# Patient Record
Sex: Female | Born: 1970 | Race: White | Hispanic: No | Marital: Married | Smoking: Former smoker
Health system: Southern US, Community
[De-identification: ages and names within clinical notes are randomized; demographics above are authoritative.]

## PROBLEM LIST (undated history)

## (undated) DIAGNOSIS — I471 Supraventricular tachycardia, unspecified: Secondary | ICD-10-CM

## (undated) DIAGNOSIS — C801 Malignant (primary) neoplasm, unspecified: Secondary | ICD-10-CM

## (undated) DIAGNOSIS — E039 Hypothyroidism, unspecified: Secondary | ICD-10-CM

## (undated) DIAGNOSIS — F419 Anxiety disorder, unspecified: Secondary | ICD-10-CM

## (undated) DIAGNOSIS — I499 Cardiac arrhythmia, unspecified: Secondary | ICD-10-CM

## (undated) HISTORY — PX: ABDOMINAL HYSTERECTOMY: SHX81

## (undated) HISTORY — PX: TONSILLECTOMY: SUR1361

## (undated) HISTORY — PX: ULNAR NERVE REPAIR: SHX2594

## (undated) HISTORY — PX: CARDIAC ELECTROPHYSIOLOGY MAPPING AND ABLATION: SHX1292

---

## 2004-08-10 ENCOUNTER — Inpatient Hospital Stay: Payer: Self-pay | Admitting: Internal Medicine

## 2004-10-10 ENCOUNTER — Emergency Department: Payer: Self-pay | Admitting: Emergency Medicine

## 2004-10-10 ENCOUNTER — Other Ambulatory Visit: Payer: Self-pay

## 2005-09-19 ENCOUNTER — Emergency Department: Payer: Self-pay | Admitting: Internal Medicine

## 2005-09-19 ENCOUNTER — Other Ambulatory Visit: Payer: Self-pay

## 2005-09-24 ENCOUNTER — Ambulatory Visit: Payer: Self-pay | Admitting: Family Medicine

## 2006-04-09 ENCOUNTER — Other Ambulatory Visit: Payer: Self-pay

## 2006-04-09 ENCOUNTER — Emergency Department: Payer: Self-pay

## 2006-07-07 ENCOUNTER — Ambulatory Visit: Payer: Self-pay | Admitting: Family Medicine

## 2007-08-27 ENCOUNTER — Ambulatory Visit: Payer: Self-pay | Admitting: Unknown Physician Specialty

## 2007-08-31 ENCOUNTER — Ambulatory Visit: Payer: Self-pay | Admitting: Unknown Physician Specialty

## 2010-03-20 ENCOUNTER — Ambulatory Visit: Payer: Self-pay | Admitting: Gastroenterology

## 2010-03-22 LAB — PATHOLOGY REPORT

## 2010-08-12 ENCOUNTER — Ambulatory Visit: Payer: Self-pay | Admitting: Physical Medicine and Rehabilitation

## 2011-10-09 ENCOUNTER — Ambulatory Visit: Payer: Self-pay | Admitting: Gastroenterology

## 2011-10-16 LAB — PATHOLOGY REPORT

## 2012-04-30 ENCOUNTER — Ambulatory Visit: Payer: Self-pay | Admitting: Family Medicine

## 2013-02-17 ENCOUNTER — Ambulatory Visit: Payer: Self-pay | Admitting: Family Medicine

## 2013-05-04 ENCOUNTER — Ambulatory Visit: Payer: Self-pay | Admitting: Family Medicine

## 2013-07-23 ENCOUNTER — Ambulatory Visit: Payer: Self-pay

## 2014-06-14 ENCOUNTER — Ambulatory Visit: Payer: Self-pay

## 2014-06-18 LAB — WOUND CULTURE

## 2014-07-05 ENCOUNTER — Ambulatory Visit: Payer: Self-pay | Admitting: Family Medicine

## 2014-09-02 ENCOUNTER — Ambulatory Visit
Admit: 2014-09-02 | Disposition: A | Discharge status: Home / Self Care | Payer: Self-pay | Attending: Family Medicine | Admitting: Family Medicine

## 2015-03-02 ENCOUNTER — Encounter: Payer: Self-pay | Admitting: *Deleted

## 2015-03-03 ENCOUNTER — Ambulatory Visit: Payer: 59 | Admitting: Anesthesiology

## 2015-03-03 ENCOUNTER — Encounter: Payer: Self-pay | Admitting: Anesthesiology

## 2015-03-03 ENCOUNTER — Encounter
Admission: RE | Disposition: A | Discharge status: Home / Self Care | Payer: Self-pay | Source: Ambulatory Visit | Attending: Gastroenterology

## 2015-03-03 ENCOUNTER — Ambulatory Visit
Admission: RE | Admit: 2015-03-03 | Discharge: 2015-03-03 | Disposition: A | Discharge status: Home / Self Care | Payer: 59 | Source: Ambulatory Visit | Attending: Gastroenterology | Admitting: Gastroenterology

## 2015-03-03 DIAGNOSIS — F419 Anxiety disorder, unspecified: Secondary | ICD-10-CM | POA: Insufficient documentation

## 2015-03-03 DIAGNOSIS — Z888 Allergy status to other drugs, medicaments and biological substances status: Secondary | ICD-10-CM | POA: Diagnosis not present

## 2015-03-03 DIAGNOSIS — Z800 Family history of malignant neoplasm of digestive organs: Secondary | ICD-10-CM | POA: Diagnosis not present

## 2015-03-03 DIAGNOSIS — Z1211 Encounter for screening for malignant neoplasm of colon: Secondary | ICD-10-CM | POA: Insufficient documentation

## 2015-03-03 DIAGNOSIS — K5730 Diverticulosis of large intestine without perforation or abscess without bleeding: Secondary | ICD-10-CM | POA: Diagnosis not present

## 2015-03-03 DIAGNOSIS — Z79899 Other long term (current) drug therapy: Secondary | ICD-10-CM | POA: Insufficient documentation

## 2015-03-03 DIAGNOSIS — E039 Hypothyroidism, unspecified: Secondary | ICD-10-CM | POA: Insufficient documentation

## 2015-03-03 DIAGNOSIS — Z880 Allergy status to penicillin: Secondary | ICD-10-CM | POA: Insufficient documentation

## 2015-03-03 DIAGNOSIS — Z882 Allergy status to sulfonamides status: Secondary | ICD-10-CM | POA: Diagnosis not present

## 2015-03-03 DIAGNOSIS — D125 Benign neoplasm of sigmoid colon: Secondary | ICD-10-CM | POA: Insufficient documentation

## 2015-03-03 HISTORY — DX: Anxiety disorder, unspecified: F41.9

## 2015-03-03 HISTORY — DX: Hypothyroidism, unspecified: E03.9

## 2015-03-03 HISTORY — DX: Cardiac arrhythmia, unspecified: I49.9

## 2015-03-03 HISTORY — PX: COLONOSCOPY WITH PROPOFOL: SHX5780

## 2015-03-03 SURGERY — COLONOSCOPY WITH PROPOFOL
Anesthesia: General

## 2015-03-03 MED ORDER — FENTANYL CITRATE (PF) 100 MCG/2ML IJ SOLN
INTRAMUSCULAR | Status: DC | PRN
  Administered 2015-03-03: 50 ug via INTRAVENOUS

## 2015-03-03 MED ORDER — EPHEDRINE SULFATE 50 MG/ML IJ SOLN
INTRAMUSCULAR | Status: DC | PRN
  Administered 2015-03-03: 15 mg via INTRAVENOUS

## 2015-03-03 MED ORDER — SODIUM CHLORIDE 0.9 % IV SOLN
INTRAVENOUS | Status: DC
  Administered 2015-03-03: 1000 mL via INTRAVENOUS

## 2015-03-03 MED ORDER — SODIUM CHLORIDE 0.9 % IV SOLN: INTRAVENOUS | Status: DC

## 2015-03-03 MED ORDER — PROPOFOL 500 MG/50ML IV EMUL
INTRAVENOUS | Status: DC | PRN
  Administered 2015-03-03: 120 ug/kg/min via INTRAVENOUS

## 2015-03-03 MED ORDER — MIDAZOLAM HCL 2 MG/2ML IJ SOLN
INTRAMUSCULAR | Status: DC | PRN
  Administered 2015-03-03: 1 mg via INTRAVENOUS

## 2015-03-03 MED ORDER — NEBIVOLOL HCL 5 MG PO TABS
2.5000 mg | ORAL_TABLET | Freq: Once | ORAL | Status: AC
  Administered 2015-03-03: 2.5 mg via ORAL
  Filled 2015-03-03: qty 1

## 2015-03-03 NOTE — Transfer of Care (Signed)
Immediate Anesthesia Transfer of Care Note  Patient: Misty Murphy  Procedure(s) Performed: Procedure(s): COLONOSCOPY WITH PROPOFOL (N/A)  Patient Location: PACU  Anesthesia Type:General  Level of Consciousness: awake  Airway & Oxygen Therapy: Patient Spontanous Breathing  Post-op Assessment: Report given to RN and Post -op Vital signs reviewed and stable  Post vital signs: Reviewed and stable  Last Vitals:  Filed Vitals:   03/03/15 0821  BP: 125/76  Pulse: 85  Temp: 37.9 C  Resp: 16    Complications: No apparent anesthesia complications

## 2015-03-03 NOTE — Op Note (Signed)
[facility] Gastroenterology Patient Name: Misty Murphy Procedure Date: 03/03/2015 8:44 AM MRN: [id number] Account #: [id number] Date of Birth: [birth year] Admit Type: Outpatient Age: 44 Room: [facility] ENDO ROOM 3 Gender: Female Note Status: Finalized Procedure:         Colonoscopy Indications:       Family history of colon cancer in a first-degree relative Providers:         Lollie Sails, MD Referring MD:      Shirline Frees (Referring MD) Medicines:         Monitored Anesthesia Care Complications:     No immediate complications. Procedure:         Pre-Anesthesia Assessment:                    - ASA Grade Assessment: II - A patient with mild systemic                     disease.                    After obtaining informed consent, the colonoscope was                     passed under direct vision. Throughout the procedure, the                     patient's blood pressure, pulse, and oxygen saturations                     were monitored continuously. The Colonoscope was                     introduced through the anus and advanced to the the cecum,                     identified by appendiceal orifice and ileocecal valve. The                     colonoscopy was performed without difficulty. The patient                     tolerated the procedure well. The quality of the bowel                     preparation was good. Findings:      Three sessile polyps were found in the recto-sigmoid colon. The polyps       were 1 to 2 mm in size. These polyps were removed with a cold biopsy       forceps. Resection and retrieval were complete.      A less than 1 mm polyp was found in the proximal sigmoid colon. The       polyp was sessile. The polyp was removed with a cold biopsy forceps.       Resection and retrieval were complete.      Multiple small-mouthed diverticula were found in the sigmoid colon and       in the descending colon.      The digital rectal exam was  normal. Impression:        - Three 1 to 2 mm polyps at the recto-sigmoid colon.                     Resected and retrieved.                    -  One less than 1 mm polyp in the proximal sigmoid colon.                     Resected and retrieved.                    - Diverticulosis in the sigmoid colon and in the                     descending colon. Recommendation:    - Await pathology results.                    - Telephone GI clinic for pathology results in 1 week. Procedure Code(s): --- Professional ---                    [phone], Colonoscopy, flexible; with biopsy, single or                     multiple Diagnosis Code(s): --- Professional ---                    211.4, Benign neoplasm of rectum and anal canal                    211.3, Benign neoplasm of colon                    V16.0, Family history of malignant neoplasm of                     gastrointestinal tract                    562.10, Diverticulosis of colon (without mention of                     hemorrhage) CPT copyright 2014 American Medical Association. All rights reserved. The codes documented in this report are preliminary and upon coder review may  be revised to meet current compliance requirements. Lollie Sails, MD 03/03/2015 9:16:43 AM This report has been signed electronically. Number of Addenda: 0 Note Initiated On: 03/03/2015 8:44 AM Scope Withdrawal Time: 0 hours 7 minutes 25 seconds  Total Procedure Duration: 0 hours 17 minutes 4 seconds       [facility]

## 2015-03-03 NOTE — Anesthesia Preprocedure Evaluation (Signed)
Anesthesia Evaluation  Patient identified by MRN, date of birth, ID band Patient awake    Reviewed: Allergy & Precautions, H&P , NPO status , Patient's Chart, lab work & pertinent test results  History of Anesthesia Complications Negative for: history of anesthetic complications  Airway Mallampati: II  TM Distance: >3 FB Neck ROM: full    Dental  (+) Poor Dentition, Chipped, Missing   Pulmonary neg shortness of breath, asthma , Current Smoker,    Pulmonary exam normal breath sounds clear to auscultation       Cardiovascular Exercise Tolerance: Good (-) Past MI Normal cardiovascular exam+ dysrhythmias Supra Ventricular Tachycardia  Rhythm:regular Rate:Normal     Neuro/Psych PSYCHIATRIC DISORDERS Anxiety negative neurological ROS  negative psych ROS   GI/Hepatic negative GI ROS, Neg liver ROS, neg GERD  ,  Endo/Other  Hypothyroidism   Renal/GU negative Renal ROS  negative genitourinary   Musculoskeletal   Abdominal   Peds  Hematology negative hematology ROS (+)   Anesthesia Other Findings Past Medical History:   Dysrhythmia                                                  Anxiety                                                      Hypothyroidism                                              Patient reports baseline cough  Reproductive/Obstetrics negative OB ROS                             Anesthesia Physical Anesthesia Plan  ASA: III  Anesthesia Plan: General   Post-op Pain Management:    Induction:   Airway Management Planned:   Additional Equipment:   Intra-op Plan:   Post-operative Plan:   Informed Consent: I have reviewed the patients History and Physical, chart, labs and discussed the procedure including the risks, benefits and alternatives for the proposed anesthesia with the patient or authorized representative who has indicated his/her understanding and acceptance.    Dental Advisory Given  Plan Discussed with: Anesthesiologist, CRNA and Surgeon  Anesthesia Plan Comments:         Anesthesia Quick Evaluation

## 2015-03-03 NOTE — H&P (Signed)
Outpatient short stay form Pre-procedure 03/03/2015 8:34 AM Lollie Sails MD  Primary Physician: Dr Sherrin Daisy  Reason for visit:  Colonoscopy  History of present illness:  Patient is a 44 year old female presenting today for a colonoscopy. She has a family history of her brother being diagnosed with colon cancer at the age of 7. This her second colonoscopy. She tolerated her prep well. He takes no aspirin products or blood thinners.    Current facility-administered medications:  .  0.9 %  sodium chloride infusion, , Intravenous, Continuous, Lollie Sails, MD .  0.9 %  sodium chloride infusion, , Intravenous, Continuous, Lollie Sails, MD  Prescriptions prior to admission  Medication Sig Dispense Refill Last Dose  . calcium carbonate (OS-CAL - DOSED IN MG OF ELEMENTAL CALCIUM) 1250 (500 CA) MG tablet Take 1 tablet by mouth daily.   Past Week at Unknown time  . citalopram (CELEXA) 10 MG tablet Take 10 mg by mouth daily.   Past Week at Unknown time  . levothyroxine (SYNTHROID, LEVOTHROID) 125 MCG tablet Take 125 mcg by mouth daily before breakfast.   Past Week at Unknown time  . Multiple Vitamin (MULTIVITAMIN) tablet Take 1 tablet by mouth daily.   Past Week at Unknown time  . nebivolol (BYSTOLIC) 2.5 MG tablet Take 2.5 mg by mouth daily.   Past Week at Unknown time     Allergies  Allergen Reactions  . Neosporin [Neomycin-Bacitracin Zn-Polymyx]   . Penicillins Hives  . Sulfa Antibiotics   . Iodinated Diagnostic Agents Rash     Past Medical History  Diagnosis Date  . Dysrhythmia   . Anxiety   . Hypothyroidism     Review of systems:      Physical Exam    Heart and lungs: Regular rate and rhythm without rub or gallop, lungs are bilaterally clear    HEENT: Normocephalic atraumatic eyes are anicteric    Other:     Pertinant exam for procedure: Soft nontender nondistended bowel sounds positive normoactive    Planned proceedures: Colonoscopy and  indicated procedures. I have discussed the risks benefits and complications of procedures to include not limited to bleeding, infection, perforation and the risk of sedation and the patient wishes to proceed.    Lollie Sails, MD Gastroenterology 03/03/2015  8:34 AM

## 2015-03-03 NOTE — Anesthesia Procedure Notes (Signed)
Performed by: COOK-MARTIN, Modene Andy Pre-anesthesia Checklist: Patient identified, Emergency Drugs available, Suction available, Patient being monitored and Timeout performed Patient Re-evaluated:Patient Re-evaluated prior to inductionOxygen Delivery Method: Nasal cannula Preoxygenation: Pre-oxygenation with 100% oxygen Intubation Type: IV induction Placement Confirmation: positive ETCO2 and CO2 detector       

## 2015-03-03 NOTE — Anesthesia Postprocedure Evaluation (Signed)
  Anesthesia Post-op Note  Patient: Misty Murphy  Procedure(s) Performed: Procedure(s): COLONOSCOPY WITH PROPOFOL (N/A)  Anesthesia type:General  Patient location: PACU  Post pain: Pain level controlled  Post assessment: Post-op Vital signs reviewed, Patient's Cardiovascular Status Stable, Respiratory Function Stable, Patent Airway and No signs of Nausea or vomiting  Post vital signs: Reviewed and stable  Last Vitals:  Filed Vitals:   03/03/15 0940  BP: 95/56  Pulse: 69  Temp:   Resp: 15    Level of consciousness: awake, alert  and patient cooperative  Complications: No apparent anesthesia complications

## 2015-03-06 ENCOUNTER — Encounter: Payer: Self-pay | Admitting: Gastroenterology

## 2015-03-07 LAB — SURGICAL PATHOLOGY

## 2015-03-08 NOTE — Addendum Note (Signed)
Addendum  created 03/08/15 9166 by Andria Frames, MD   Modules edited: Anesthesia Attestations

## 2015-12-22 ENCOUNTER — Ambulatory Visit
Admission: EM | Admit: 2015-12-22 | Discharge: 2015-12-22 | Disposition: A | Discharge status: Home / Self Care | Payer: 59 | Attending: Family Medicine | Admitting: Family Medicine

## 2015-12-22 DIAGNOSIS — M461 Sacroiliitis, not elsewhere classified: Secondary | ICD-10-CM

## 2015-12-22 HISTORY — DX: Supraventricular tachycardia, unspecified: I47.10

## 2015-12-22 HISTORY — DX: Supraventricular tachycardia: I47.1

## 2015-12-22 MED ORDER — HYDROCODONE-ACETAMINOPHEN 5-325 MG PO TABS: ORAL_TABLET | ORAL | 0 refills | Status: DC

## 2015-12-22 NOTE — Discharge Instructions (Signed)
Rest Ice Ibuprofen 800mg  three times daily with food Pain medicine as needed

## 2015-12-22 NOTE — ED Triage Notes (Signed)
Pt c/o right lower back  Pain since Tuesday night, denies any injury. States she has Degenerative disc disease.

## 2015-12-22 NOTE — ED Provider Notes (Signed)
[facility]    CSN: [id number] Arrival date & time: 12/22/15  1650  First Provider Contact:  None       History   Chief Complaint Chief Complaint  Patient presents with  . Back Pain    HPI Misty Murphy is a 45 y.o. female.   45 yo female with a c/o right sided low back pain for 2 days. Denies any injuries, falls, trauma, numbness/tingling.    The history is provided by the patient.    Past Medical History:  Diagnosis Date  . Anxiety   . Dysrhythmia   . Hypothyroidism   . SVT (supraventricular tachycardia) (HCC)     There are no active problems to display for this patient.   Past Surgical History:  Procedure Laterality Date  . ABDOMINAL HYSTERECTOMY    . CARDIAC ELECTROPHYSIOLOGY MAPPING AND ABLATION     x2  . COLONOSCOPY WITH PROPOFOL N/A 03/03/2015   Procedure: COLONOSCOPY WITH PROPOFOL;  Surgeon: Lollie Sails, MD;  Location: [facility] ENDOSCOPY;  Service: Endoscopy;  Laterality: N/A;  . TONSILLECTOMY    . ULNAR NERVE REPAIR      OB History    No data available       Home Medications    Prior to Admission medications   Medication Sig Start Date End Date Taking? Authorizing Provider  calcium carbonate (OS-CAL - DOSED IN MG OF ELEMENTAL CALCIUM) 1250 (500 CA) MG tablet Take 1 tablet by mouth daily.    Historical Provider, MD  citalopram (CELEXA) 10 MG tablet Take 10 mg by mouth daily.    Historical Provider, MD  HYDROcodone-acetaminophen (NORCO/VICODIN) 5-325 MG tablet 1-2 tab po q 8 hours prn 12/22/15   Norval Gable, MD  levothyroxine (SYNTHROID, LEVOTHROID) 125 MCG tablet Take 125 mcg by mouth daily before breakfast.    Historical Provider, MD  Multiple Vitamin (MULTIVITAMIN) tablet Take 1 tablet by mouth daily.    Historical Provider, MD  nebivolol (BYSTOLIC) 2.5 MG tablet Take 2.5 mg by mouth daily.    Historical Provider, MD    Family History No family history on file.  Social History Social History  Substance Use Topics  .  Smoking status: Current Every Day Smoker  . Smokeless tobacco: Never Used  . Alcohol use Yes     Allergies   Neosporin [neomycin-bacitracin zn-polymyx]; Penicillins; Sulfa antibiotics; and Iodinated diagnostic agents   Review of Systems Review of Systems   Physical Exam Triage Vital Signs ED Triage Vitals  Enc Vitals Group     BP 12/22/15 1702 (!) 146/79     Pulse Rate 12/22/15 1702 89     Resp 12/22/15 1702 17     Temp 12/22/15 1702 98.1 F (36.7 C)     Temp Source 12/22/15 1702 Oral     SpO2 12/22/15 1702 98 %     Weight 12/22/15 1702 160 lb (72.6 kg)     Height 12/22/15 1702 5\' 9"  (1.753 m)     Head Circumference --      Peak Flow --      Pain Score 12/22/15 1708 9     Pain Loc --      Pain Edu? --      Excl. in [place]? --    No data found.   Updated Vital Signs BP (!) 146/79 (BP Location: Right Arm)   Pulse 89   Temp 98.1 F (36.7 C) (Oral)   Resp 17   Ht 5\' 9"  (1.753 m)  Wt 160 lb (72.6 kg)   SpO2 98%   BMI 23.63 kg/m   Visual Acuity Right Eye Distance:   Left Eye Distance:   Bilateral Distance:    Right Eye Near:   Left Eye Near:    Bilateral Near:     Physical Exam  Constitutional: She appears well-developed and well-nourished. No distress.  Musculoskeletal: She exhibits tenderness. She exhibits no edema.       Lumbar back: She exhibits tenderness (point tenderness at the sacroiliac joint). She exhibits normal range of motion, no bony tenderness, no swelling, no edema, no deformity, no laceration, no pain, no spasm and normal pulse.  Neurological: She is alert. She has normal reflexes. She displays normal reflexes. She exhibits normal muscle tone.  Skin: Skin is warm and dry. No rash noted. She is not diaphoretic. No erythema.  Nursing note and vitals reviewed.    UC Treatments / Results  Labs (all labs ordered are listed, but only abnormal results are displayed) Labs Reviewed - No data to display  EKG  EKG Interpretation None        Radiology No results found.  Procedures Procedures (including critical care time)  Medications Ordered in UC Medications - No data to display   Initial Impression / Assessment and Plan / UC Course  I have reviewed the triage vital signs and the nursing notes.  Pertinent labs & imaging results that were available during my care of the patient were reviewed by me and considered in my medical decision making (see chart for details).  Clinical Course      Final Clinical Impressions(s) / UC Diagnoses   Final diagnoses:  Sacroiliitis ([place])    New Prescriptions Discharge Medication List as of 12/22/2015  6:03 PM    START taking these medications   Details  HYDROcodone-acetaminophen (NORCO/VICODIN) 5-325 MG tablet 1-2 tab po q 8 hours prn, Print        1.  diagnosis reviewed with patient 2. rx as per orders above; reviewed possible side effects, interactions, risks and benefits  3. Recommend supportive treatment with otc nsaids, ice 4. Follow-up prn if symptoms worsen or don't improve   Norval Gable, MD 12/22/15 [phone]

## 2015-12-29 ENCOUNTER — Other Ambulatory Visit: Payer: Self-pay | Admitting: Family Medicine

## 2015-12-29 DIAGNOSIS — Z1231 Encounter for screening mammogram for malignant neoplasm of breast: Secondary | ICD-10-CM

## 2016-01-09 ENCOUNTER — Ambulatory Visit: Admission: RE | Admit: 2016-01-09 | Payer: 59 | Source: Ambulatory Visit

## 2016-01-15 ENCOUNTER — Other Ambulatory Visit: Payer: Self-pay | Admitting: Family Medicine

## 2016-01-15 ENCOUNTER — Ambulatory Visit
Admission: RE | Admit: 2016-01-15 | Discharge: 2016-01-15 | Disposition: A | Discharge status: Home / Self Care | Payer: 59 | Source: Ambulatory Visit | Attending: Family Medicine | Admitting: Family Medicine

## 2016-01-15 DIAGNOSIS — Z1231 Encounter for screening mammogram for malignant neoplasm of breast: Secondary | ICD-10-CM

## 2016-01-15 HISTORY — DX: Malignant (primary) neoplasm, unspecified: C80.1

## 2017-01-01 ENCOUNTER — Other Ambulatory Visit: Payer: Self-pay | Admitting: Endocrinology

## 2017-01-01 DIAGNOSIS — Z1231 Encounter for screening mammogram for malignant neoplasm of breast: Secondary | ICD-10-CM

## 2017-01-16 ENCOUNTER — Ambulatory Visit
Admission: RE | Admit: 2017-01-16 | Discharge: 2017-01-16 | Disposition: A | Discharge status: Home / Self Care | Payer: 59 | Source: Ambulatory Visit | Attending: Endocrinology | Admitting: Endocrinology

## 2017-01-16 DIAGNOSIS — Z1231 Encounter for screening mammogram for malignant neoplasm of breast: Secondary | ICD-10-CM | POA: Diagnosis present

## 2017-03-18 ENCOUNTER — Ambulatory Visit
Admission: EM | Admit: 2017-03-18 | Discharge: 2017-03-18 | Disposition: A | Discharge status: Home / Self Care | Payer: 59 | Attending: Emergency Medicine | Admitting: Emergency Medicine

## 2017-03-18 DIAGNOSIS — J441 Chronic obstructive pulmonary disease with (acute) exacerbation: Secondary | ICD-10-CM | POA: Diagnosis not present

## 2017-03-18 DIAGNOSIS — R05 Cough: Secondary | ICD-10-CM

## 2017-03-18 MED ORDER — BENZONATATE 200 MG PO CAPS: ORAL_CAPSULE | ORAL | 0 refills | Status: DC

## 2017-03-18 MED ORDER — PREDNISONE 20 MG PO TABS: ORAL_TABLET | ORAL | 0 refills | Status: DC

## 2017-03-18 MED ORDER — HYDROCOD POLST-CPM POLST ER 10-8 MG/5ML PO SUER: 5.0000 mL | Freq: Two times a day (BID) | ORAL | 0 refills | Status: DC

## 2017-03-18 MED ORDER — DOXYCYCLINE HYCLATE 100 MG PO CAPS: 100.0000 mg | ORAL_CAPSULE | Freq: Two times a day (BID) | ORAL | 0 refills | Status: DC

## 2017-03-18 MED ORDER — ALBUTEROL SULFATE HFA 108 (90 BASE) MCG/ACT IN AERS: 1.0000 | INHALATION_SPRAY | Freq: Four times a day (QID) | RESPIRATORY_TRACT | 0 refills | Status: DC | PRN

## 2017-03-18 NOTE — ED Triage Notes (Signed)
Patient complains of cough, congestion and sore throat x 2.5 weeks. Sore throat is mostly at night and in the morning. Non productive cough per patient.Patient has tried mucinex, nyquil and robitussin with no relief. No fevers.

## 2017-03-18 NOTE — ED Provider Notes (Signed)
[facility]    CSN: [id number] Arrival date & time: 03/18/17  1748     History   Chief Complaint Chief Complaint  Patient presents with  . Cough    HPI Misty Murphy is a 46 y.o. female.   HPI  This 46 year old female who presents with a 2-1/2 week history of cough congestion and a sore throat. Cough is nonproductive. She tried over the counter medications to include Mucinex NyQuil and Robitussin with no relief. Fever or chills. He continues to smoke half to 1 pack per day. He sates that she has been diagnosed with COPD in the past but has never had a confirmed with a breathing test. O2 sats in room air 100%. She is afebrile 98.3. Pulse 88 blood pressure 121/63. She has had  shortness of breath.           Past Medical History:  Diagnosis Date  . Anxiety   . Cancer ([place])    cervical ca  . Dysrhythmia   . Hypothyroidism   . SVT (supraventricular tachycardia) (HCC)     There are no active problems to display for this patient.   Past Surgical History:  Procedure Laterality Date  . ABDOMINAL HYSTERECTOMY    . CARDIAC ELECTROPHYSIOLOGY MAPPING AND ABLATION     x2  . COLONOSCOPY WITH PROPOFOL N/A 03/03/2015   Procedure: COLONOSCOPY WITH PROPOFOL;  Surgeon: Lollie Sails, MD;  Location: [facility] ENDOSCOPY;  Service: Endoscopy;  Laterality: N/A;  . TONSILLECTOMY    . ULNAR NERVE REPAIR      OB History    No data available       Home Medications    Prior to Admission medications   Medication Sig Start Date End Date Taking? Authorizing Provider  levothyroxine (SYNTHROID, LEVOTHROID) 125 MCG tablet Take 125 mcg by mouth daily before breakfast.   Yes [provider]  albuterol (PROVENTIL HFA;VENTOLIN HFA) 108 (90 Base) MCG/ACT inhaler Inhale 1-2 puffs into the lungs every 6 (six) hours as needed for wheezing or shortness of breath. Use with spacer 03/18/17   Lorin Picket, PA-C  benzonatate (TESSALON) 200 MG capsule Take one cap TID  PRN cough 03/18/17   Lorin Picket, PA-C  calcium carbonate (OS-CAL - DOSED IN MG OF ELEMENTAL CALCIUM) 1250 (500 CA) MG tablet Take 1 tablet by mouth daily.    [provider]  chlorpheniramine-HYDROcodone (TUSSIONEX PENNKINETIC ER) 10-8 MG/5ML SUER Take 5 mLs by mouth 2 (two) times daily. 03/18/17   Lorin Picket, PA-C  citalopram (CELEXA) 10 MG tablet Take 10 mg by mouth daily.    [provider]  doxycycline (VIBRAMYCIN) 100 MG capsule Take 1 capsule (100 mg total) by mouth 2 (two) times daily. 03/18/17   Lorin Picket, PA-C  HYDROcodone-acetaminophen (NORCO/VICODIN) 5-325 MG tablet 1-2 tab po q 8 hours prn 12/22/15   Norval Gable, MD  Multiple Vitamin (MULTIVITAMIN) tablet Take 1 tablet by mouth daily.    [provider]  nebivolol (BYSTOLIC) 2.5 MG tablet Take 2.5 mg by mouth daily.    [provider]  predniSONE (DELTASONE) 20 MG tablet Take 2 tablets (40 mg) daily by mouth 03/18/17   Lorin Picket, PA-C    Family History Family History  Problem Relation Age of Onset  . Breast cancer Maternal Aunt 55    Social History Social History  Substance Use Topics  . Smoking status: Current Every Day Smoker  . Smokeless tobacco: Never Used  . Alcohol  use Yes     Allergies   Neosporin [neomycin-bacitracin zn-polymyx]; Penicillins; Sulfa antibiotics; and Iodinated diagnostic agents   Review of Systems Review of Systems  Constitutional: Positive for activity change. Negative for appetite change, chills, fatigue and fever.  Respiratory: Positive for cough and shortness of breath. Negative for wheezing.   All other systems reviewed and are negative.    Physical Exam Triage Vital Signs ED Triage Vitals  Enc Vitals Group     BP 03/18/17 1803 121/63     Pulse Rate 03/18/17 1803 88     Resp --      Temp 03/18/17 1803 98.3 F (36.8 C)     Temp Source 03/18/17 1803 Oral     SpO2 03/18/17 1803 100 %     Weight 03/18/17 1806 163 lb  (73.9 kg)     Height 03/18/17 1806 5\' 9"  (1.753 m)     Head Circumference --      Peak Flow --      Pain Score --      Pain Loc --      Pain Edu? --      Excl. in [place]? --    No data found.   Updated Vital Signs BP 121/63 (BP Location: Left Arm)   Pulse 88   Temp 98.3 F (36.8 C) (Oral)   Ht 5\' 9"  (1.753 m)   Wt 163 lb (73.9 kg)   SpO2 100%   BMI 24.07 kg/m   Visual Acuity Right Eye Distance:   Left Eye Distance:   Bilateral Distance:    Right Eye Near:   Left Eye Near:    Bilateral Near:     Physical Exam  Constitutional: She is oriented to person, place, and time. She appears well-developed and well-nourished. No distress.  HENT:  Head: Normocephalic.  Right Ear: External ear normal.  Left Ear: External ear normal.  Nose: Nose normal.  Mouth/Throat: Oropharynx is clear and moist. No oropharyngeal exudate.  Eyes: Pupils are equal, round, and reactive to light. Right eye exhibits no discharge. Left eye exhibits no discharge.  Neck: Normal range of motion. Neck supple.  Pulmonary/Chest: Effort normal and breath sounds normal. No stridor.  Musculoskeletal: Normal range of motion.  Lymphadenopathy:    She has no cervical adenopathy.  Neurological: She is alert and oriented to person, place, and time.  Skin: Skin is warm and dry. She is not diaphoretic.  Psychiatric: She has a normal mood and affect. Her behavior is normal. Judgment and thought content normal.  Nursing note and vitals reviewed.    UC Treatments / Results  Labs (all labs ordered are listed, but only abnormal results are displayed) Labs Reviewed - No data to display  EKG  EKG Interpretation None       Radiology No results found.  Procedures Procedures (including critical care time)  Medications Ordered in UC Medications - No data to display   Initial Impression / Assessment and Plan / UC Course  I have reviewed the triage vital signs and the nursing notes.  Pertinent labs &  imaging results that were available during my care of the patient were reviewed by me and considered in my medical decision making (see chart for details).     Plan: 1. Test/x-ray results and diagnosis reviewed with patient 2. rx as per orders; risks, benefits, potential side effects reviewed with patient 3. Recommend supportive treatment with smoking sensation. Will start her on a short course of prednisone. Provided her with  Tussionex for nighttime use. She is cautioned regarding driving and activities requiring concentration and judgment. Recommended that she follow-up with her primary care physician for further evaluation for diagnosis of COPD. 4. F/u prn if symptoms worsen or don't improve   Final Clinical Impressions(s) / UC Diagnoses   Final diagnoses:  COPD exacerbation ([place])    New Prescriptions Discharge Medication List as of 03/18/2017  6:59 PM    START taking these medications   Details  albuterol (PROVENTIL HFA;VENTOLIN HFA) 108 (90 Base) MCG/ACT inhaler Inhale 1-2 puffs into the lungs every 6 (six) hours as needed for wheezing or shortness of breath. Use with spacer, Starting Tue 03/18/2017, Normal    benzonatate (TESSALON) 200 MG capsule Take one cap TID PRN cough, Normal    chlorpheniramine-HYDROcodone (TUSSIONEX PENNKINETIC ER) 10-8 MG/5ML SUER Take 5 mLs by mouth 2 (two) times daily., Starting Tue 03/18/2017, Print    doxycycline (VIBRAMYCIN) 100 MG capsule Take 1 capsule (100 mg total) by mouth 2 (two) times daily., Starting Tue 03/18/2017, Normal    predniSONE (DELTASONE) 20 MG tablet Take 2 tablets (40 mg) daily by mouth, Normal         Controlled Substance Prescriptions [place] Controlled Substance Registry consulted? Not Applicable   Lorin Picket, PA-C 03/18/17 1918

## 2018-01-13 ENCOUNTER — Other Ambulatory Visit: Payer: Self-pay | Admitting: Endocrinology

## 2018-01-13 DIAGNOSIS — Z1231 Encounter for screening mammogram for malignant neoplasm of breast: Secondary | ICD-10-CM

## 2018-01-28 ENCOUNTER — Ambulatory Visit
Admission: RE | Admit: 2018-01-28 | Discharge: 2018-01-28 | Disposition: A | Discharge status: Home / Self Care | Payer: 59 | Source: Ambulatory Visit | Attending: Endocrinology | Admitting: Endocrinology

## 2018-01-28 DIAGNOSIS — Z1231 Encounter for screening mammogram for malignant neoplasm of breast: Secondary | ICD-10-CM | POA: Diagnosis present

## 2018-02-03 ENCOUNTER — Other Ambulatory Visit: Payer: Self-pay | Admitting: Endocrinology

## 2018-02-03 DIAGNOSIS — N6489 Other specified disorders of breast: Secondary | ICD-10-CM

## 2018-02-03 DIAGNOSIS — R928 Other abnormal and inconclusive findings on diagnostic imaging of breast: Secondary | ICD-10-CM

## 2018-02-05 ENCOUNTER — Ambulatory Visit
Admission: RE | Admit: 2018-02-05 | Discharge: 2018-02-05 | Disposition: A | Discharge status: Home / Self Care | Payer: 59 | Source: Ambulatory Visit | Attending: Endocrinology | Admitting: Endocrinology

## 2018-02-05 DIAGNOSIS — N6489 Other specified disorders of breast: Secondary | ICD-10-CM | POA: Diagnosis present

## 2018-02-05 DIAGNOSIS — R928 Other abnormal and inconclusive findings on diagnostic imaging of breast: Secondary | ICD-10-CM | POA: Insufficient documentation

## 2019-02-24 ENCOUNTER — Other Ambulatory Visit: Payer: Self-pay | Admitting: Endocrinology

## 2019-02-24 DIAGNOSIS — Z1231 Encounter for screening mammogram for malignant neoplasm of breast: Secondary | ICD-10-CM

## 2019-03-10 ENCOUNTER — Other Ambulatory Visit: Payer: Self-pay

## 2019-03-10 ENCOUNTER — Encounter (INDEPENDENT_AMBULATORY_CARE_PROVIDER_SITE_OTHER): Payer: Self-pay

## 2019-03-10 ENCOUNTER — Ambulatory Visit
Admission: RE | Admit: 2019-03-10 | Discharge: 2019-03-10 | Disposition: A | Discharge status: Home / Self Care | Payer: 59 | Source: Ambulatory Visit | Attending: Endocrinology | Admitting: Endocrinology

## 2019-03-10 DIAGNOSIS — Z1231 Encounter for screening mammogram for malignant neoplasm of breast: Secondary | ICD-10-CM | POA: Diagnosis present

## 2019-03-11 ENCOUNTER — Other Ambulatory Visit: Payer: Self-pay | Admitting: Endocrinology

## 2019-03-11 DIAGNOSIS — N6489 Other specified disorders of breast: Secondary | ICD-10-CM

## 2019-03-11 DIAGNOSIS — R921 Mammographic calcification found on diagnostic imaging of breast: Secondary | ICD-10-CM

## 2019-03-11 DIAGNOSIS — R928 Other abnormal and inconclusive findings on diagnostic imaging of breast: Secondary | ICD-10-CM

## 2019-03-23 ENCOUNTER — Ambulatory Visit
Admission: RE | Admit: 2019-03-23 | Discharge: 2019-03-23 | Disposition: A | Discharge status: Home / Self Care | Payer: 59 | Source: Ambulatory Visit | Attending: Endocrinology | Admitting: Endocrinology

## 2019-03-23 DIAGNOSIS — N6489 Other specified disorders of breast: Secondary | ICD-10-CM | POA: Insufficient documentation

## 2019-03-23 DIAGNOSIS — R921 Mammographic calcification found on diagnostic imaging of breast: Secondary | ICD-10-CM | POA: Insufficient documentation

## 2019-03-23 DIAGNOSIS — R928 Other abnormal and inconclusive findings on diagnostic imaging of breast: Secondary | ICD-10-CM | POA: Insufficient documentation

## 2019-10-12 ENCOUNTER — Ambulatory Visit
Admission: EM | Admit: 2019-10-12 | Discharge: 2019-10-12 | Disposition: A | Discharge status: Home / Self Care | Payer: 59 | Attending: Emergency Medicine | Admitting: Emergency Medicine

## 2019-10-12 ENCOUNTER — Encounter: Payer: Self-pay | Admitting: Emergency Medicine

## 2019-10-12 ENCOUNTER — Other Ambulatory Visit: Payer: Self-pay

## 2019-10-12 ENCOUNTER — Ambulatory Visit (INDEPENDENT_AMBULATORY_CARE_PROVIDER_SITE_OTHER): Payer: 59

## 2019-10-12 DIAGNOSIS — S9002XA Contusion of left ankle, initial encounter: Secondary | ICD-10-CM

## 2019-10-12 NOTE — ED Provider Notes (Signed)
[facility] ____________________________________________  Time seen: Approximately 9:27 AM  I have reviewed the triage vital signs and the nursing notes.   HISTORY  Chief Complaint Ankle Injury (left)   HPI Misty Murphy is a 49 y.o. female present for evaluation of left lateral ankle pain after injury.  Patient reports this past Friday she accidentally hit her side of her ankle on a metal platform causing pain.  She has resting the area some but the pain continues.  Has taken some Tylenol and applied ice.  Denies other alleviating measures.  Pain is worse with direct palpation and activity.  Denies paresthesias, decreased range of motion or other injury.  Has continued been ambulatory.  Denies recent fevers or sickness.  Reports otherwise doing well.    Past Medical History:  Diagnosis Date  . Anxiety   . Cancer ([place])    cervical ca  . Dysrhythmia   . Hypothyroidism   . SVT (supraventricular tachycardia) (HCC)     There are no problems to display for this patient.   Past Surgical History:  Procedure Laterality Date  . ABDOMINAL HYSTERECTOMY    . CARDIAC ELECTROPHYSIOLOGY MAPPING AND ABLATION     x2  . COLONOSCOPY WITH PROPOFOL N/A 03/03/2015   Procedure: COLONOSCOPY WITH PROPOFOL;  Surgeon: Lollie Sails, MD;  Location: [facility] ENDOSCOPY;  Service: Endoscopy;  Laterality: N/A;  . TONSILLECTOMY    . ULNAR NERVE REPAIR       No current facility-administered medications for this encounter.  Current Outpatient Medications:  .  calcium carbonate (OS-CAL - DOSED IN MG OF ELEMENTAL CALCIUM) 1250 (500 CA) MG tablet, Take 1 tablet by mouth daily., Disp: , Rfl:  .  citalopram (CELEXA) 10 MG tablet, Take 10 mg by mouth daily., Disp: , Rfl:  .  levothyroxine (SYNTHROID, LEVOTHROID) 125 MCG tablet, Take 150 mcg by mouth daily before breakfast. , Disp: , Rfl:  .  Multiple Vitamin (MULTIVITAMIN) tablet, Take 1 tablet by mouth daily., Disp: , Rfl:  .  albuterol  (PROVENTIL HFA;VENTOLIN HFA) 108 (90 Base) MCG/ACT inhaler, Inhale 1-2 puffs into the lungs every 6 (six) hours as needed for wheezing or shortness of breath. Use with spacer, Disp: 1 Inhaler, Rfl: 0 .  benzonatate (TESSALON) 200 MG capsule, Take one cap TID PRN cough, Disp: 30 capsule, Rfl: 0 .  chlorpheniramine-HYDROcodone (TUSSIONEX PENNKINETIC ER) 10-8 MG/5ML SUER, Take 5 mLs by mouth 2 (two) times daily., Disp: 115 mL, Rfl: 0 .  doxycycline (VIBRAMYCIN) 100 MG capsule, Take 1 capsule (100 mg total) by mouth 2 (two) times daily., Disp: 14 capsule, Rfl: 0 .  HYDROcodone-acetaminophen (NORCO/VICODIN) 5-325 MG tablet, 1-2 tab po q 8 hours prn, Disp: 10 tablet, Rfl: 0 .  nebivolol (BYSTOLIC) 2.5 MG tablet, Take 2.5 mg by mouth daily., Disp: , Rfl:  .  predniSONE (DELTASONE) 20 MG tablet, Take 2 tablets (40 mg) daily by mouth, Disp: 8 tablet, Rfl: 0  Allergies Neosporin [neomycin-bacitracin zn-polymyx], Penicillins, Sulfa antibiotics, and Iodinated diagnostic agents  Family History  Problem Relation Age of Onset  . Breast cancer Maternal Aunt 49    Social History Social History   Tobacco Use  . Smoking status: Current Every Day Smoker  . Smokeless tobacco: Never Used  Substance Use Topics  . Alcohol use: Yes  . Drug use: No    Review of Systems Constitutional: No fever Cardiovascular: Denies chest pain. Respiratory: Denies shortness of breath. Musculoskeletal: Positive left ankle pain. Skin: Negative for rash.  ____________________________________________  PHYSICAL EXAM:  VITAL SIGNS: ED Triage Vitals  Enc Vitals Group     BP 10/12/19 0916 (!) 134/59     Pulse Rate 10/12/19 0916 63     Resp 10/12/19 0916 18     Temp 10/12/19 0916 98.3 F (36.8 C)     Temp Source 10/12/19 0916 Oral     SpO2 10/12/19 0916 100 %     Weight 10/12/19 0913 160 lb (72.6 kg)     Height 10/12/19 0913 5\' 9"  (1.753 m)     Head Circumference --      Peak Flow --      Pain Score 10/12/19 0913  2     Pain Loc --      Pain Edu? --      Excl. in [place]? --     Constitutional: Alert and oriented. Well appearing and in no acute distress. Eyes: Conjunctivae are normal.  ENT      Head: Normocephalic and atraumatic. Cardiovascular:  Good peripheral circulation. Musculoskeletal: Steady gait.  Left leg good distal pedal pulse.  Left lateral malleolus point tenderness to direct palpation with mild surrounding tenderness, mild localized lateral swelling, able to fully plantarflex and dorsiflex foot, left lower extremity otherwise nontender. Neurologic:  Normal speech and language.  Speech is normal. No gait instability.  Skin:  Skin is warm, dry and intact. No rash noted. Psychiatric: Mood and affect are normal. Speech and behavior are normal. Patient exhibits appropriate insight and judgment   ___________________________________________   LABS (all labs ordered are listed, but only abnormal results are displayed)  Labs Reviewed - No data to display ____________________________________________  RADIOLOGY  DG Ankle Complete Left  Result Date: 10/12/2019 CLINICAL DATA:  Left ankle pain after injury 4 days ago. EXAM: LEFT ANKLE COMPLETE - 3+ VIEW COMPARISON:  None. FINDINGS: There is no evidence of fracture, dislocation, or joint effusion. There is no evidence of arthropathy or other focal bone abnormality. Soft tissues are unremarkable. IMPRESSION: Negative. Electronically Signed   By: Marijo Conception M.D.   On: 10/12/2019 09:36   ____________________________________________   PROCEDURES Procedures    INITIAL IMPRESSION / ASSESSMENT AND PLAN / ED COURSE  Pertinent labs & imaging results that were available during my care of the patient were reviewed by me and considered in my medical decision making (see chart for details).  Well-appearing patient.  Mechanical injury to left ankle.  Suspect contusion.  Left ankle x-ray as above, negative.  Contusion injury.  Ice, rest, Velcro ankle  brace given.  Tylenol ibuprofen and gradual increase activity as tolerated.  Work note given.  Discussed follow up with Primary care physician or orthopedic as needed this week. Discussed follow up and return parameters including no resolution or any worsening concerns. Patient verbalized understanding and agreed to plan.   ____________________________________________   FINAL CLINICAL IMPRESSION(S) / ED DIAGNOSES  Final diagnoses:  Contusion of left ankle, initial encounter     ED Discharge Orders    None       Note: This dictation was prepared with Dragon dictation along with smaller phrase technology. Any transcriptional errors that result from this process are unintentional.         Marylene Land, NP 10/12/19 1119

## 2019-10-12 NOTE — Discharge Instructions (Signed)
Ice.  Use brace.  Elevate.  Tylenol ibuprofen as needed.  Gradually increase weightbearing as tolerated.  Follow up with your primary care physician this week as needed. Return to Urgent care for new or worsening concerns.

## 2019-10-12 NOTE — ED Triage Notes (Signed)
Patient states she hit a metal platform on her left ankle on Friday. Patient states it's painful to walk and states she walks about 7 miles at work each night.

## 2019-11-03 ENCOUNTER — Ambulatory Visit: Payer: 59 | Attending: Internal Medicine

## 2019-11-03 DIAGNOSIS — Z20822 Contact with and (suspected) exposure to covid-19: Secondary | ICD-10-CM

## 2019-11-04 LAB — NOVEL CORONAVIRUS, NAA: SARS-CoV-2, NAA: NOT DETECTED

## 2019-11-04 LAB — SARS-COV-2, NAA 2 DAY TAT

## 2020-02-14 ENCOUNTER — Other Ambulatory Visit: Payer: Self-pay | Admitting: Endocrinology

## 2020-02-14 DIAGNOSIS — Z1231 Encounter for screening mammogram for malignant neoplasm of breast: Secondary | ICD-10-CM

## 2020-03-14 ENCOUNTER — Ambulatory Visit
Admission: RE | Admit: 2020-03-14 | Discharge: 2020-03-14 | Disposition: A | Discharge status: Home / Self Care | Payer: 59 | Source: Ambulatory Visit | Attending: Endocrinology | Admitting: Endocrinology

## 2020-03-14 ENCOUNTER — Other Ambulatory Visit: Payer: Self-pay

## 2020-03-14 DIAGNOSIS — Z1231 Encounter for screening mammogram for malignant neoplasm of breast: Secondary | ICD-10-CM | POA: Diagnosis present

## 2020-10-03 ENCOUNTER — Ambulatory Visit
Admission: EM | Admit: 2020-10-03 | Discharge: 2020-10-03 | Disposition: A | Discharge status: Home / Self Care | Payer: 59

## 2020-10-03 ENCOUNTER — Other Ambulatory Visit: Payer: Self-pay

## 2020-10-03 ENCOUNTER — Encounter: Payer: Self-pay | Admitting: Emergency Medicine

## 2020-10-03 DIAGNOSIS — L609 Nail disorder, unspecified: Secondary | ICD-10-CM

## 2020-10-03 NOTE — ED Triage Notes (Signed)
Patient states her nail ripped off on her right index finger yesterday.

## 2020-10-03 NOTE — ED Provider Notes (Signed)
Right [facility]    CSN: [id number] Arrival date & time: 10/03/20  0805      History   Chief Complaint Chief Complaint  Patient presents with  . Nail Problem    HPI Misty Murphy is a 50 y.o. female presenting for fracture of the index finger nail since yesterday.  Patient states he got caught on a piece of furniture and tripped.  Denies any crushing injury of the finger.  She also cleaned it with hydrogen peroxide but not care for it in any other way.  There is some pain when this area is touched.  There is been a little bit of bleeding.  No numbness or tingling.  No swelling, bruising or redness.  No other concerns.  HPI  Past Medical History:  Diagnosis Date  . Anxiety   . Cancer ([place])    cervical ca  . Dysrhythmia   . Hypothyroidism   . SVT (supraventricular tachycardia) (HCC)     There are no problems to display for this patient.   Past Surgical History:  Procedure Laterality Date  . ABDOMINAL HYSTERECTOMY    . CARDIAC ELECTROPHYSIOLOGY MAPPING AND ABLATION     x2  . COLONOSCOPY WITH PROPOFOL N/A 03/03/2015   Procedure: COLONOSCOPY WITH PROPOFOL;  Surgeon: Lollie Sails, MD;  Location: [facility] ENDOSCOPY;  Service: Endoscopy;  Laterality: N/A;  . TONSILLECTOMY    . ULNAR NERVE REPAIR      OB History   No obstetric history on file.      Home Medications    Prior to Admission medications   Medication Sig Start Date End Date Taking? Authorizing Provider  levothyroxine (SYNTHROID, LEVOTHROID) 125 MCG tablet Take 150 mcg by mouth daily before breakfast.    Yes [provider]  albuterol (PROVENTIL HFA;VENTOLIN HFA) 108 (90 Base) MCG/ACT inhaler Inhale 1-2 puffs into the lungs every 6 (six) hours as needed for wheezing or shortness of breath. Use with spacer 03/18/17   Lorin Picket, PA-C  benzonatate (TESSALON) 200 MG capsule Take one cap TID PRN cough 03/18/17   Lorin Picket, PA-C  calcium carbonate (OS-CAL - DOSED IN MG OF  ELEMENTAL CALCIUM) 1250 (500 CA) MG tablet Take 1 tablet by mouth daily.    [provider]  chlorpheniramine-HYDROcodone (TUSSIONEX PENNKINETIC ER) 10-8 MG/5ML SUER Take 5 mLs by mouth 2 (two) times daily. 03/18/17   Lorin Picket, PA-C  citalopram (CELEXA) 10 MG tablet Take 10 mg by mouth daily.    [provider]  doxycycline (VIBRAMYCIN) 100 MG capsule Take 1 capsule (100 mg total) by mouth 2 (two) times daily. 03/18/17   Lorin Picket, PA-C  HYDROcodone-acetaminophen (NORCO/VICODIN) 5-325 MG tablet 1-2 tab po q 8 hours prn 12/22/15   Norval Gable, MD  Multiple Vitamin (MULTIVITAMIN) tablet Take 1 tablet by mouth daily.    [provider]  nebivolol (BYSTOLIC) 2.5 MG tablet Take 2.5 mg by mouth daily.    [provider]  predniSONE (DELTASONE) 20 MG tablet Take 2 tablets (40 mg) daily by mouth 03/18/17   Lorin Picket, PA-C    Family History Family History  Problem Relation Age of Onset  . Breast cancer Maternal Aunt 6    Social History Social History   Tobacco Use  . Smoking status: Current Every Day Smoker  . Smokeless tobacco: Never Used  Vaping Use  . Vaping Use: Some days  Substance Use Topics  . Alcohol use: Yes  . Drug  use: No     Allergies   Neosporin [neomycin-bacitracin zn-polymyx], Penicillins, Sulfa antibiotics, and Iodinated diagnostic agents   Review of Systems Review of Systems  Musculoskeletal: Negative for arthralgias and joint swelling.  Skin: Positive for wound. Negative for color change.  Neurological: Negative for weakness and numbness.     Physical Exam Triage Vital Signs ED Triage Vitals  Enc Vitals Group     BP 10/03/20 0817 125/63     Pulse Rate 10/03/20 0817 67     Resp 10/03/20 0817 18     Temp 10/03/20 0817 98.2 F (36.8 C)     Temp Source 10/03/20 0817 Oral     SpO2 10/03/20 0817 98 %     Weight 10/03/20 0815 160 lb (72.6 kg)     Height 10/03/20 0815 5\' 9"  (1.753 m)     Head  Circumference --      Peak Flow --      Pain Score 10/03/20 0814 4     Pain Loc --      Pain Edu? --      Excl. in [place]? --    No data found.  Updated Vital Signs BP 125/63 (BP Location: Left Arm)   Pulse 67   Temp 98.2 F (36.8 C) (Oral)   Resp 18   Ht 5\' 9"  (1.753 m)   Wt 160 lb (72.6 kg)   SpO2 98%   BMI 23.63 kg/m       Physical Exam Vitals and nursing note reviewed.  Constitutional:      General: She is not in acute distress.    Appearance: Normal appearance. She is not ill-appearing or toxic-appearing.  HENT:     Head: Normocephalic and atraumatic.  Eyes:     General: No scleral icterus.       Right eye: No discharge.        Left eye: No discharge.     Conjunctiva/sclera: Conjunctivae normal.  Cardiovascular:     Rate and Rhythm: Normal rate and regular rhythm.     Pulses: Normal pulses.  Pulmonary:     Effort: Pulmonary effort is normal. No respiratory distress.  Musculoskeletal:     Cervical back: Neck supple.  Skin:    General: Skin is dry.     Comments: There is a linear fracture of the nail of the right index finger.  It is not all the way across.  There is some mild bleeding underneath the nail.  This area is tender.  No bony tenderness.  Neurological:     General: No focal deficit present.     Mental Status: She is alert. Mental status is at baseline.     Motor: No weakness.     Gait: Gait normal.  Psychiatric:        Mood and Affect: Mood normal.        Behavior: Behavior normal.        Thought Content: Thought content normal.      UC Treatments / Results  Labs (all labs ordered are listed, but only abnormal results are displayed) Labs Reviewed - No data to display  EKG   Radiology No results found.  Procedures Procedures (including critical care time)  Medications Ordered in UC Medications - No data to display  Initial Impression / Assessment and Plan / UC Course  I have reviewed the triage vital signs and the nursing  notes.  Pertinent labs & imaging results that were available during my care of the  patient were reviewed by me and considered in my medical decision making (see chart for details).   51 year old female presenting for fractured nail.  Area cleaned today with saline and Hibiclens.  Patient declined to have it bandaged.  I advised her that the nail needs to be clipped and then the nail polish/acrylic needs to be soaked off with acetone.  We do not have the ability to do that here at the urgent care today.  Advised her that she can do this at home and explained how to do it or she can go back to the nail salon and have them do it.  Advised if she does not home then she should make sure to keep the area clean and bandaged to monitor for infection.  Final Clinical Impressions(s) / UC Diagnoses   Final diagnoses:  Nail problem     Discharge Instructions     You are advised to clip the nail at home and soak the child/acrylic off with acetone.  Then keep this area clean and bandaged.  Monitor for infection and follow-up for any infections    ED Prescriptions    None     PDMP not reviewed this encounter.   Danton Clap, PA-C 10/03/20 [id number]

## 2020-10-03 NOTE — Discharge Instructions (Addendum)
You are advised to clip the nail at home and soak the child/acrylic off with acetone.  Then keep this area clean and bandaged.  Monitor for infection and follow-up for any infections

## 2021-02-15 ENCOUNTER — Other Ambulatory Visit: Payer: Self-pay | Admitting: Endocrinology

## 2021-02-15 ENCOUNTER — Other Ambulatory Visit: Payer: Self-pay | Admitting: Family Medicine

## 2021-02-15 DIAGNOSIS — Z1231 Encounter for screening mammogram for malignant neoplasm of breast: Secondary | ICD-10-CM

## 2021-03-19 ENCOUNTER — Other Ambulatory Visit: Payer: Self-pay

## 2021-03-19 ENCOUNTER — Ambulatory Visit
Admission: RE | Admit: 2021-03-19 | Discharge: 2021-03-19 | Disposition: A | Discharge status: Home / Self Care | Payer: 59 | Source: Ambulatory Visit | Attending: Family Medicine | Admitting: Family Medicine

## 2021-03-19 DIAGNOSIS — Z1231 Encounter for screening mammogram for malignant neoplasm of breast: Secondary | ICD-10-CM | POA: Diagnosis not present

## 2021-08-15 ENCOUNTER — Other Ambulatory Visit: Payer: Self-pay

## 2021-08-15 ENCOUNTER — Emergency Department
Admission: EM | Admit: 2021-08-15 | Discharge: 2021-08-15 | Disposition: A | Discharge status: Home / Self Care | Payer: 59 | Attending: Student in an Organized Health Care Education/Training Program | Admitting: Student in an Organized Health Care Education/Training Program

## 2021-08-15 ENCOUNTER — Encounter: Payer: Self-pay | Admitting: Emergency Medicine

## 2021-08-15 ENCOUNTER — Emergency Department: Payer: 59

## 2021-08-15 DIAGNOSIS — R002 Palpitations: Secondary | ICD-10-CM | POA: Diagnosis present

## 2021-08-15 DIAGNOSIS — R0602 Shortness of breath: Secondary | ICD-10-CM | POA: Diagnosis not present

## 2021-08-15 DIAGNOSIS — R42 Dizziness and giddiness: Secondary | ICD-10-CM | POA: Insufficient documentation

## 2021-08-15 LAB — CBC
HCT: 44.7 % (ref 36.0–46.0)
Hemoglobin: 15 g/dL (ref 12.0–15.0)
MCH: 30.7 pg (ref 26.0–34.0)
MCHC: 33.6 g/dL (ref 30.0–36.0)
MCV: 91.4 fL (ref 80.0–100.0)
Platelets: 214 10*3/uL (ref 150–400)
RBC: 4.89 MIL/uL (ref 3.87–5.11)
RDW: 12.1 % (ref 11.5–15.5)
WBC: 7.2 10*3/uL (ref 4.0–10.5)
nRBC: 0 % (ref 0.0–0.2)

## 2021-08-15 LAB — TROPONIN I (HIGH SENSITIVITY)
Troponin I (High Sensitivity): 3 ng/L (ref <18)
Troponin I (High Sensitivity): 3 ng/L (ref <18)

## 2021-08-15 LAB — BASIC METABOLIC PANEL
Anion gap: 9 (ref 5–15)
BUN: 11 mg/dL (ref 6–20)
CO2: 24 mmol/L (ref 22–32)
Calcium: 9.6 mg/dL (ref 8.9–10.3)
Chloride: 105 mmol/L (ref 98–111)
Creatinine, Ser: 1.01 mg/dL — ABNORMAL HIGH (ref 0.44–1.00)
GFR, Estimated: >60 mL/min (ref >60)
Glucose, Bld: 106 mg/dL — ABNORMAL HIGH (ref 70–99)
Potassium: 3.7 mmol/L (ref 3.5–5.1)
Sodium: 138 mmol/L (ref 135–145)

## 2021-08-15 LAB — TSH: TSH: 1.724 u[IU]/mL (ref 0.350–4.500)

## 2021-08-15 LAB — D-DIMER, QUANTITATIVE: D-Dimer, Quant: 0.32 ug/mL-FEU (ref 0.00–0.50)

## 2021-08-15 MED ORDER — ACETAMINOPHEN 325 MG PO TABS
650.0000 mg | ORAL_TABLET | Freq: Once | ORAL | Status: AC
  Administered 2021-08-15: 650 mg via ORAL
  Filled 2021-08-15: qty 2

## 2021-08-15 MED ORDER — METOPROLOL TARTRATE 25 MG PO TABS
12.5000 mg | ORAL_TABLET | Freq: Once | ORAL | Status: AC
Start: 2021-08-15 — End: 2021-08-15
  Administered 2021-08-15: 12.5 mg via ORAL
  Filled 2021-08-15: qty 1

## 2021-08-15 NOTE — ED Triage Notes (Signed)
Pt to ED via POV, pt states that she has hx/o of SVT, PVT, and RVR. Pt states that today around 1300 she started feeling like her heart was racing. Pt states that she is also having shortness of breath. Pt reports that when she has these episodes that it does not normally last this long. Pt appears anxious. Pt is able to speak in complete sentences at this time.  ?

## 2021-08-15 NOTE — ED Provider Notes (Signed)
? ?[facility] ?Provider Note ? ? ? Event Date/Time  ? First MD Initiated Contact with Patient 08/15/21 1642   ?  (approximate) ? ? ?History  ? ?Palpitations and Shortness of Breath ? ? ?HPI ? ?Misty Murphy is a 51 y.o. female with a history of SVT as well as A-fib flutter status post previous ablations currently with Holter monitor place presents the ER for evaluation of chest pain palpitation shortness of breath lasted more than an hour while she was at work today.  States she is having trouble catching her breath.  No diaphoresis.  States that she she is feeling lightheaded.  No nausea or vomiting.  Heart rate was showing A-fib with RVR on her monitor heart rates in the 140s.  She is on diltiazem and has metoprolol as needed but did not have that with her she came to the ER.  When seen in triage she converted to sinus rhythm.  She feels much improved. ?  ? ? ?Physical Exam  ? ?Triage Vital Signs: ?ED Triage Vitals  ?Enc Vitals Group  ?   BP 08/15/21 1608 127/77  ?   Pulse Rate 08/15/21 1604 (!) 103  ?   Resp 08/15/21 1604 (!) 22  ?   Temp 08/15/21 1608 98 ?F (36.7 ?C)  ?   Temp Source 08/15/21 1608 Oral  ?   SpO2 08/15/21 1604 99 %  ?   Weight 08/15/21 1605 160 lb (72.6 kg)  ?   Height 08/15/21 1605 '5\' 9"'$  (1.753 m)  ?   Head Circumference --   ?   Peak Flow --   ?   Pain Score 08/15/21 1604 0  ?   Pain Loc --   ?   Pain Edu? --   ?   Excl. in [place]? --   ? ? ?Most recent vital signs: ?Vitals:  ? 08/15/21 1816 08/15/21 1829  ?BP: 123/77 123/77  ?Pulse: 71 60  ?Resp:  20  ?Temp:    ?SpO2:  100%  ? ? ? ?Constitutional: Alert  ?Eyes: Conjunctivae are normal.  ?Head: Atraumatic. ?Nose: No congestion/rhinnorhea. ?Mouth/Throat: Mucous membranes are moist.   ?Neck: Painless ROM.  ?Cardiovascular:   Good peripheral circulation. No m/g/r ?Respiratory: Normal respiratory effort.  No retractions.  ?Gastrointestinal: Soft and nontender.  ?Musculoskeletal:  no deformity ?Neurologic:  MAE spontaneously.  No gross focal neurologic deficits are appreciated.  ?Skin:  Skin is warm, dry and intact. No rash noted. ?Psychiatric: Mood and affect are normal. Speech and behavior are normal. ? ? ? ?ED Results / Procedures / Treatments  ? ?Labs ?(all labs ordered are listed, but only abnormal results are displayed) ?Labs Reviewed  ?BASIC METABOLIC PANEL - Abnormal; Notable for the following components:  ?    Result Value  ? Glucose, Bld 106 (*)   ? Creatinine, Ser 1.01 (*)   ? All other components within normal limits  ?CBC  ?TSH  ?D-DIMER, QUANTITATIVE  ?TROPONIN I (HIGH SENSITIVITY)  ?TROPONIN I (HIGH SENSITIVITY)  ? ? ? ?EKG ? ?ED ECG REPORT ?I, Merlyn Lot, the attending physician, personally viewed and interpreted this ECG. ? ? Date: 08/15/2021 ? EKG Time: 16:04 ? Rate: 100 ? Rhythm: sinus with occasional pac ? Axis: normal ? Intervals: normal ? ST&T Change: nonspecific st abn, no stemi ? ? ? ?RADIOLOGY ?Please see ED Course for my review and interpretation. ? ?I personally reviewed all radiographic images ordered to evaluate for the above acute complaints and  reviewed radiology reports and findings.  These findings were personally discussed with the patient.  Please see medical record for radiology report. ? ? ? ?PROCEDURES: ? ?Critical Care performed: No ? ?Procedures ? ? ?MEDICATIONS ORDERED IN ED: ?Medications  ?acetaminophen (TYLENOL) tablet 650 mg (has no administration in time range)  ?metoprolol tartrate (LOPRESSOR) tablet 12.5 mg (12.5 mg Oral Given 08/15/21 1816)  ? ? ? ?IMPRESSION / MDM / ASSESSMENT AND PLAN / ED COURSE  ?I reviewed the triage vital signs and the nursing notes. ?             ?               ? ?Differential diagnosis includes, but is not limited to, palpitations, electrolyte abnormality, ACS, PE, hyperthyroid ? ?Patient presenting with symptoms as described above currently well-appearing in no acute distress.  On her monitor does appear that she was in RVR but has self converted back to sinus  rhythm.  She is already established with cardiology and EP.  Will give p.o. metoprolol and observe. ? ? ?Clinical Course as of 08/15/21 1927  ?Wed Aug 15, 2021  ?1716 Chest x-ray on my review and interpretation does not show any evidence of consolidation or pneumothorax. [PR]  ?1923 D-dimer negative.  Tsh normal.  Patient feels well.  No wheezing or cough.  Remains in sinus rhythm in 60's on  telemetry.  Will repeat trop and if negative will be appropriate for outpatient follow up.  Patient agreeable to plan. [PR]  ?  ?Clinical Course User Index ?[PR] Merlyn Lot, MD  ? ? ? ?FINAL CLINICAL IMPRESSION(S) / ED DIAGNOSES  ? ?Final diagnoses:  ?Palpitations  ? ? ? ?Rx / DC Orders  ? ?ED Discharge Orders   ? ? None  ? ?  ? ? ? ?Note:  This document was prepared using Dragon voice recognition software and may include unintentional dictation errors. ? ?  ?Merlyn Lot, MD ?08/15/21 1927 ? ?

## 2021-08-15 NOTE — Discharge Instructions (Addendum)
Call you cardiologist for follow up as soon as possible.  Continue to take your medications as prescribed. ?

## 2021-10-26 IMAGING — MG MM DIGITAL DIAGNOSTIC UNILAT*L* W/ TOMO
8 series · 9 of 20 positions shown · non-contrast
Comparison: 03/10/2019 and earlier priors

CLINICAL DATA: 48-year-old patient recalled from recent screening
mammogram for evaluation possible asymmetry with interspersed
calcifications in the 12 o'clock region of the left breast.

EXAM:
DIGITAL DIAGNOSTIC UNILATERAL LEFT MAMMOGRAM WITH CAD AND TOMO

[L CC]
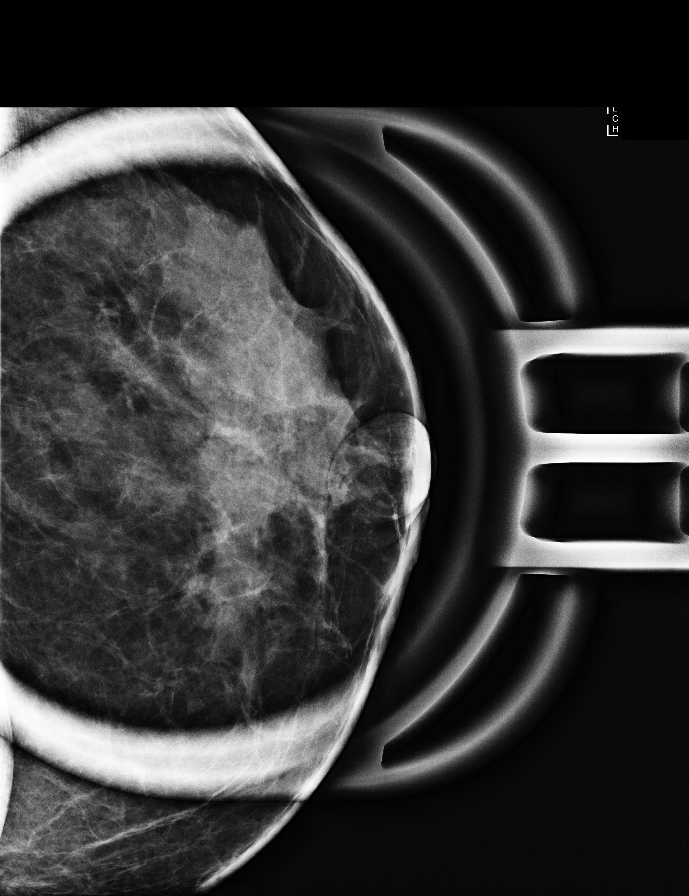

[L ML]
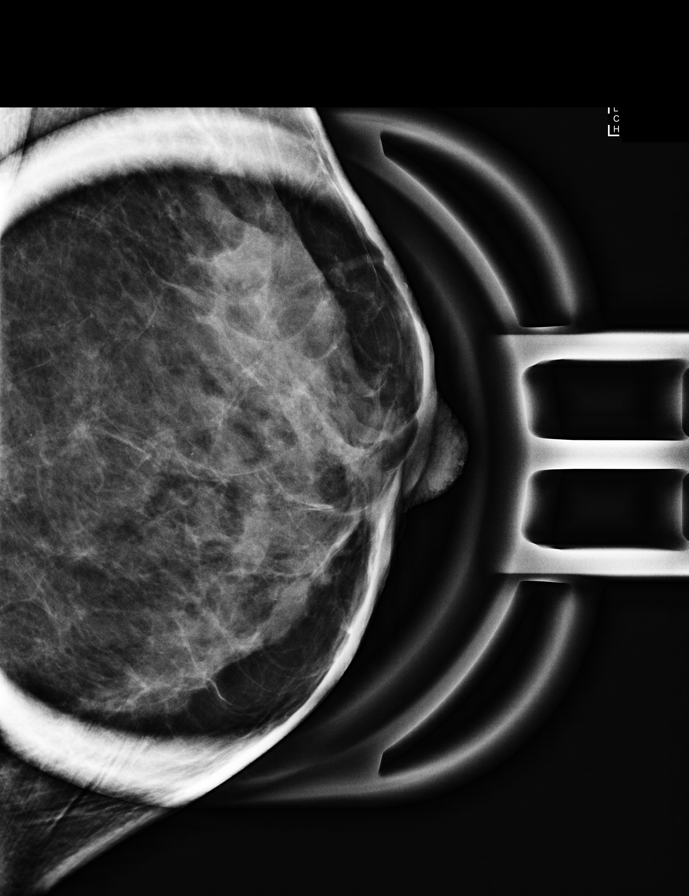

[L ML synth-2D]
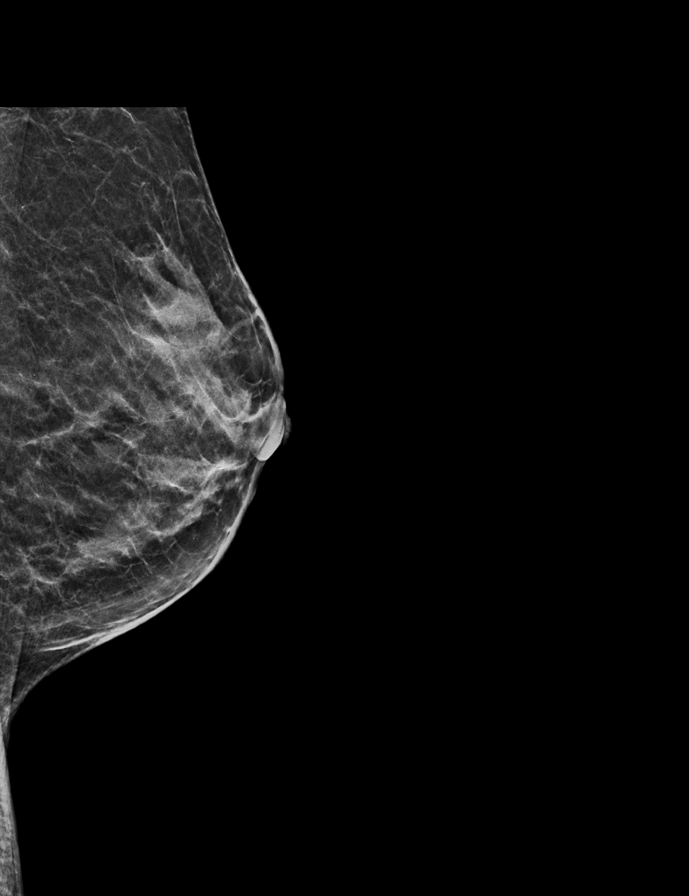

[L CC synth-2D]
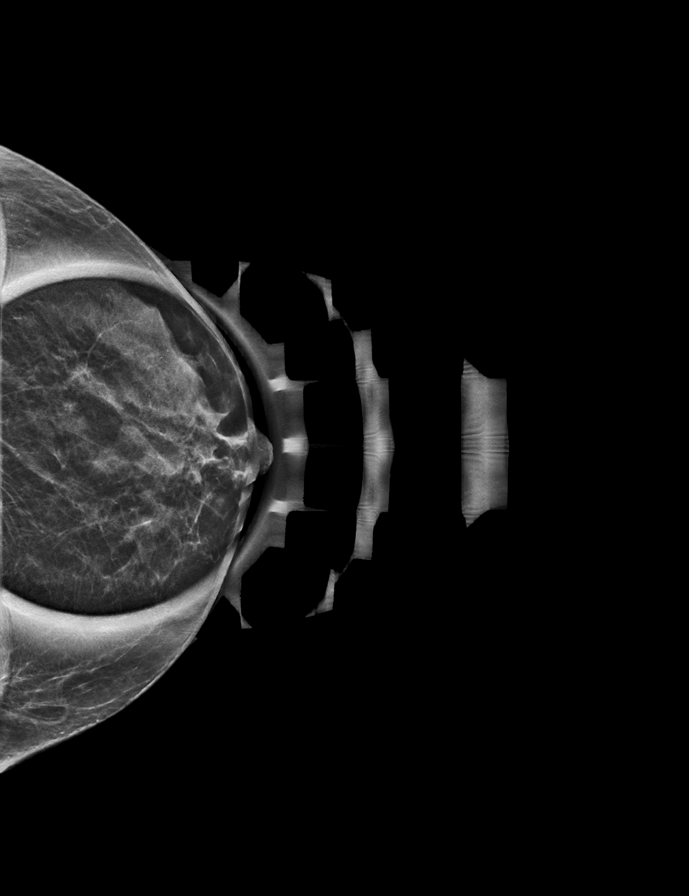

[L MLO synth-2D]
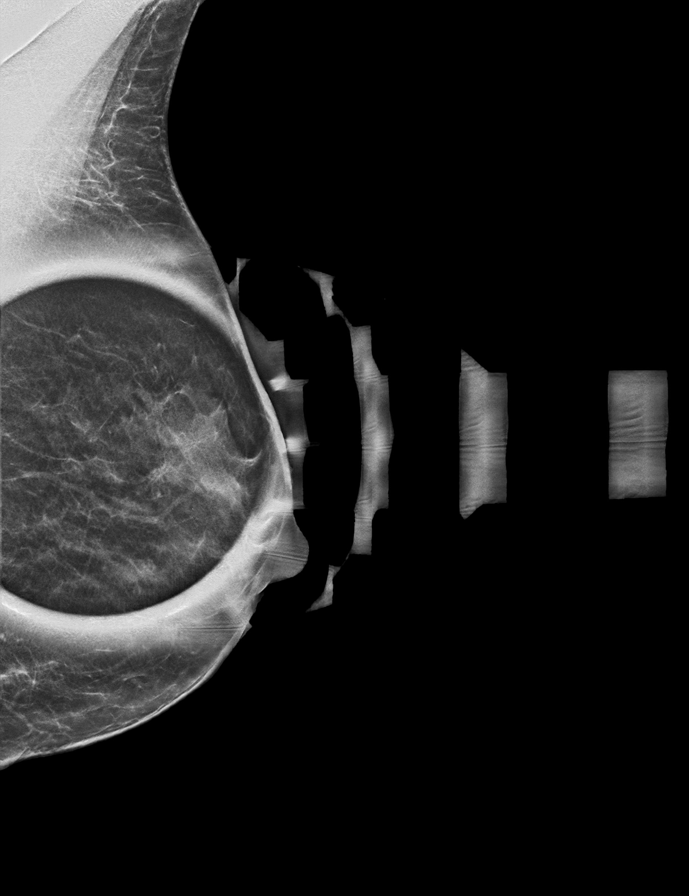

[L CC tomo · 2 of 35 frames shown]
[frame 12/35]
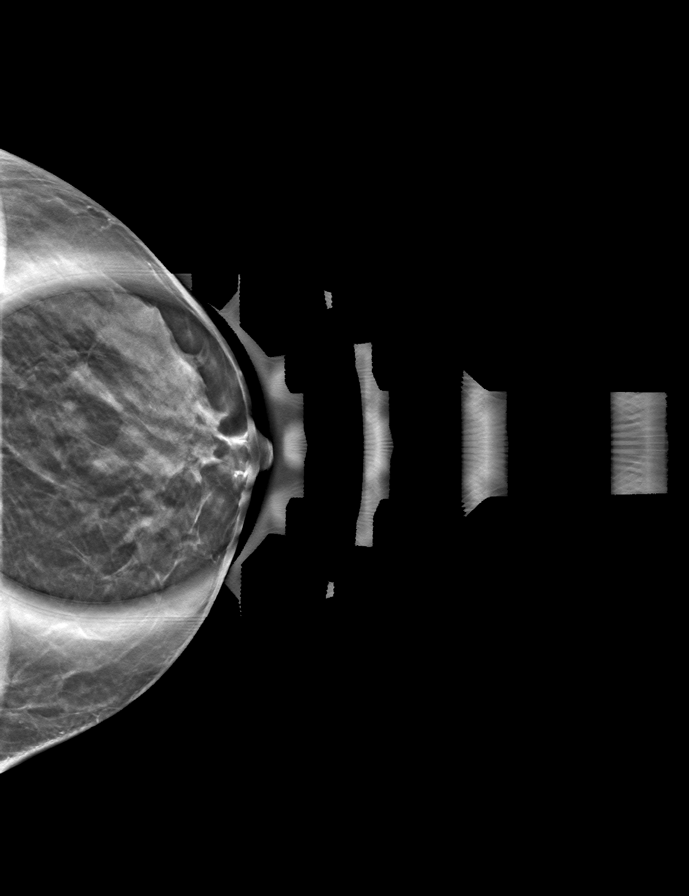
[frame 18/35]
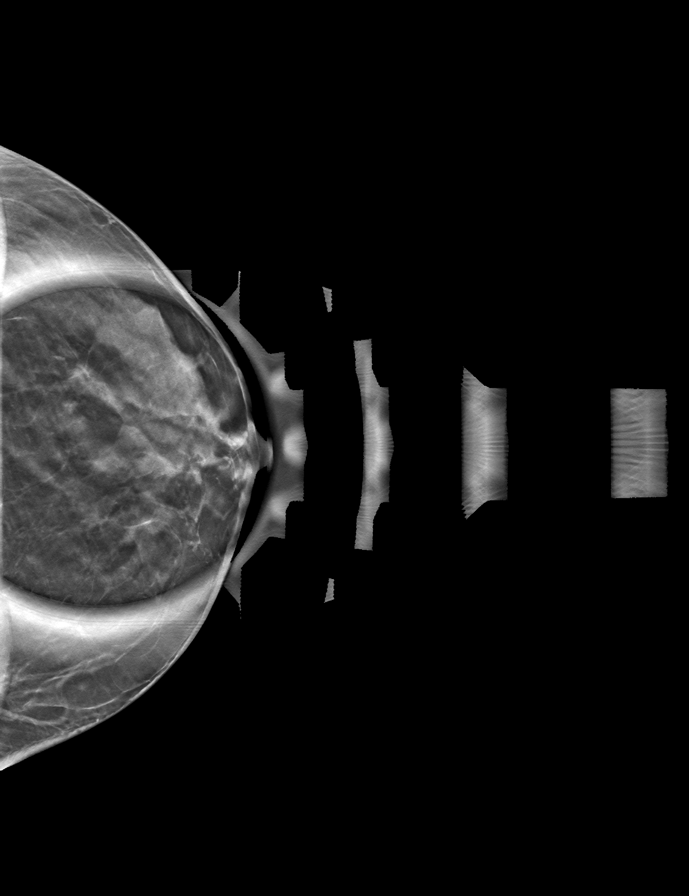

[L MLO tomo · tomo slice 17/34.0]
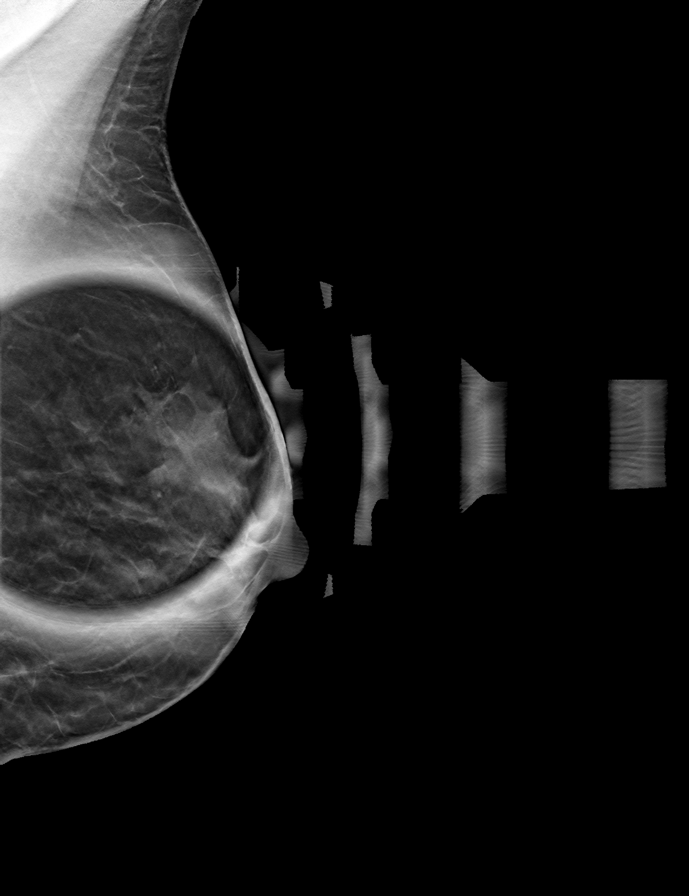

[L ML tomo · tomo slice 17/34.0]
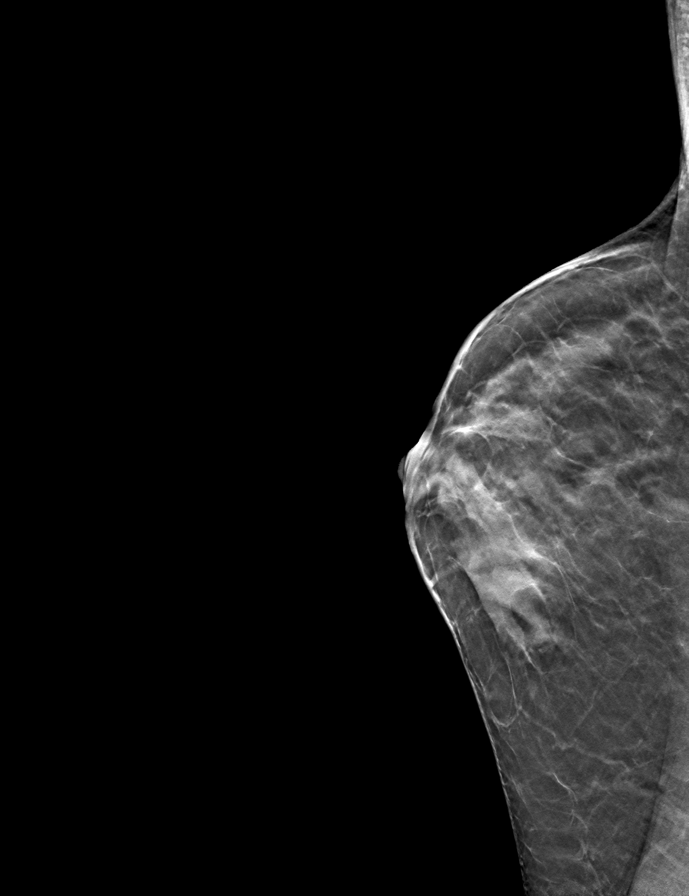

[9 of 20 positions shown; findings below may reference images not displayed]

ACR Breast Density Category c: The breast tissue is heterogeneously
dense, which may obscure small masses.
FINDINGS: Compression views of the retroareolar 12 o'clock left breast show no
persistent asymmetry or mass. Punctate microcalcifications in the
retroareolar left breast are evaluated with magnification views.
There are loosely grouped and scattered punctate microcalcifications
throughout the retroareolar left breast, with no suspicious
morphologies or suspicious grouping. No malignant type
calcifications.

Mammographic images were processed with CAD.
IMPRESSION: No evidence of malignancy in the left breast. Benign scattered
punctate microcalcifications.

RECOMMENDATION:
Screening mammogram in one year.(Code:G1-A-4Q7)

I have discussed the findings and recommendations with the patient.
If applicable, a reminder letter will be sent to the patient
regarding the next appointment.

BI-RADS CATEGORY  2: Benign.

## 2021-12-13 ENCOUNTER — Ambulatory Visit
Admission: EM | Admit: 2021-12-13 | Discharge: 2021-12-13 | Disposition: A | Discharge status: Home / Self Care | Payer: 59 | Attending: Emergency Medicine | Admitting: Emergency Medicine

## 2021-12-13 DIAGNOSIS — Z87891 Personal history of nicotine dependence: Secondary | ICD-10-CM | POA: Diagnosis not present

## 2021-12-13 DIAGNOSIS — Z7901 Long term (current) use of anticoagulants: Secondary | ICD-10-CM | POA: Diagnosis not present

## 2021-12-13 DIAGNOSIS — I4891 Unspecified atrial fibrillation: Secondary | ICD-10-CM | POA: Diagnosis not present

## 2021-12-13 DIAGNOSIS — J029 Acute pharyngitis, unspecified: Secondary | ICD-10-CM | POA: Diagnosis not present

## 2021-12-13 DIAGNOSIS — Z20822 Contact with and (suspected) exposure to covid-19: Secondary | ICD-10-CM | POA: Insufficient documentation

## 2021-12-13 DIAGNOSIS — R5381 Other malaise: Secondary | ICD-10-CM | POA: Insufficient documentation

## 2021-12-13 DIAGNOSIS — E039 Hypothyroidism, unspecified: Secondary | ICD-10-CM | POA: Insufficient documentation

## 2021-12-13 DIAGNOSIS — I4892 Unspecified atrial flutter: Secondary | ICD-10-CM | POA: Insufficient documentation

## 2021-12-13 LAB — GROUP A STREP BY PCR: Group A Strep by PCR: NOT DETECTED

## 2021-12-13 LAB — SARS CORONAVIRUS 2 BY RT PCR: SARS Coronavirus 2 by RT PCR: NEGATIVE

## 2021-12-13 MED ORDER — ALUM & MAG HYDROXIDE-SIMETH 200-200-20 MG/5ML PO SUSP
30.0000 mL | Freq: Once | ORAL | Status: AC
  Administered 2021-12-13: 30 mL via ORAL

## 2021-12-13 MED ORDER — LIDOCAINE VISCOUS HCL 2 % MT SOLN
15.0000 mL | Freq: Once | OROMUCOSAL | Status: AC
  Administered 2021-12-13: 15 mL via OROMUCOSAL

## 2021-12-13 NOTE — ED Provider Notes (Signed)
HPI  SUBJECTIVE:  Patient reports sore throat, malaise starting yesterday. Sx worse with swallowing, talking.  Sx better with ice chips. Has been taking Mucinex w/ o relief.  No fever   No neck stiffness  No Cough No nasal congestion, rhinorrhea No Myalgias No Headache No Rash  No loss of taste or smell No shortness of breath or difficulty breathing No nausea, vomiting No diarrhea No abdominal pain     No Recent Strep, mono, COVID, flu exposure No reflux sxs No Allergy sxs  No Breathing difficulty, sensation of throat swelling shut + Raspy, but not muffled voice No Drooling No Trismus No abx in past month.  No antipyretic in past 4-6 hrs.  Patient is a former smoker. Patient has a past medical history of atrial fibrillation/flutter, on Eliquis, status post ablation in May.  She also has a history of hypothyroidism and is status post tonsillectomy.  No history of GERD, allergy, hypertension, pulmonary disease, diabetes. PCP: [facility].   Past Medical History:  Diagnosis Date   Anxiety    Cancer ([place])    cervical ca   Dysrhythmia    Hypothyroidism    SVT (supraventricular tachycardia) ([place])     Past Surgical History:  Procedure Laterality Date   ABDOMINAL HYSTERECTOMY     CARDIAC ELECTROPHYSIOLOGY MAPPING AND ABLATION     x2   COLONOSCOPY WITH PROPOFOL N/A 03/03/2015   Procedure: COLONOSCOPY WITH PROPOFOL;  Surgeon: Lollie Sails, MD;  Location: [facility] ENDOSCOPY;  Service: Endoscopy;  Laterality: N/A;   TONSILLECTOMY     ULNAR NERVE REPAIR      Family History  Problem Relation Age of Onset   Breast cancer Maternal Aunt 102    Social History   Tobacco Use   Smoking status: Every Day   Smokeless tobacco: Never  Vaping Use   Vaping Use: Some days  Substance Use Topics   Alcohol use: Yes   Drug use: No    No current facility-administered medications for this encounter.  Current Outpatient Medications:    aspirin 81 MG chewable  tablet, Chew 81 mg by mouth daily., Disp: , Rfl:    citalopram (CELEXA) 10 MG tablet, Take 10 mg by mouth daily., Disp: , Rfl:    colchicine 0.6 MG tablet, Take 0.6 mg by mouth daily., Disp: , Rfl:    diltiazem (CARDIZEM CD) 180 MG 24 hr capsule, Take by mouth., Disp: , Rfl:    ELIQUIS 5 MG TABS tablet, Take 5 mg by mouth 2 (two) times daily., Disp: , Rfl:    levothyroxine (SYNTHROID, LEVOTHROID) 125 MCG tablet, Take 150 mcg by mouth daily before breakfast. , Disp: , Rfl:    metoprolol tartrate (LOPRESSOR) 25 MG tablet, SMARTSIG:0.5-1 Tablet(s) By Mouth PRN, Disp: , Rfl:    Multiple Vitamin (MULTIVITAMIN) tablet, Take 1 tablet by mouth daily., Disp: , Rfl:    calcium carbonate (OS-CAL - DOSED IN MG OF ELEMENTAL CALCIUM) 1250 (500 CA) MG tablet, Take 1 tablet by mouth daily., Disp: , Rfl:    nebivolol (BYSTOLIC) 2.5 MG tablet, Take 2.5 mg by mouth daily., Disp: , Rfl:    predniSONE (DELTASONE) 20 MG tablet, Take 2 tablets (40 mg) daily by mouth, Disp: 8 tablet, Rfl: 0  Allergies  Allergen Reactions   Neosporin [Neomycin-Bacitracin Zn-Polymyx]    Penicillins Hives   Sulfa Antibiotics    Iodinated Contrast Media Rash     ROS  As noted in HPI.   Physical Exam  BP 132/74 (BP  Location: Left Arm)   Pulse 76   Temp 98.8 F (37.1 C) (Oral)   Resp 16   Ht '5\' 9"'$  (1.753 m)   Wt 72.6 kg   SpO2 98%   BMI 23.63 kg/m   Constitutional: Well developed, well nourished, no acute distress raspy, but no muffled voice Eyes:  EOMI, conjunctiva normal bilaterally HENT: Normocephalic, atraumatic,mucus membranes moist.  No nasal congestion.  Slightly erythematous oropharynx.  Tonsils surgically absent.  Uvula midline.  No soft palate swelling.  Positive cobblestoning.  No postnasal drip.  No drooling, trismus. Respiratory: Normal inspiratory effort Cardiovascular: Normal rate, no murmurs, rubs, gallops GI: nondistended, nontender. No appreciable splenomegaly skin: No rash, skin intact Lymph: No  anterior cervical LN.  No posterior cervical lymphadenopathy Musculoskeletal: no deformities Neurologic: Alert & oriented x 3, no focal neuro deficits Psychiatric: Speech and behavior appropriate.  ED Course   Medications  alum & mag hydroxide-simeth (MAALOX/MYLANTA) 200-200-20 MG/5ML suspension 30 mL (30 mLs Oral Given 12/13/21 2003)  lidocaine (XYLOCAINE) 2 % viscous mouth solution 15 mL (15 mLs Mouth/Throat Given 12/13/21 2003)    Orders Placed This Encounter  Procedures   Group A Strep by PCR    Standing Status:   Standing    Number of Occurrences:   1    Order Specific Question:   Patient immune status    Answer:   Normal   SARS Coronavirus 2 by RT PCR (hospital order, performed in [place] hospital lab) *cepheid single result test* Anterior Nasal Swab    Standing Status:   Standing    Number of Occurrences:   1    Results for orders placed or performed during the hospital encounter of 12/13/21 (from the past 24 hour(s))  Group A Strep by PCR     Status: None   Collection Time: 12/13/21  6:06 PM   Specimen: Throat; Sterile Swab  Result Value Ref Range   Group A Strep by PCR NOT DETECTED NOT DETECTED  SARS Coronavirus 2 by RT PCR (hospital order, performed in [place] hospital lab) *cepheid single result test* Anterior Nasal Swab     Status: None   Collection Time: 12/13/21  6:06 PM   Specimen: Anterior Nasal Swab  Result Value Ref Range   SARS Coronavirus 2 by RT PCR NEGATIVE NEGATIVE   No results found.  ED Clinical Impression  1. Sore throat      ED Assessment/Plan   COVID, strep PCR negative.  No evidence of tonsillar abscess, retropharyngeal abscess.  Seriously doubt epiglottitis.  Offered to get lateral soft tissue neck x-ray to evaluate for this, but patient declined. patient home with Tylenol, Benadryl/Maalox mixture.  Advised her to try some Pepcid in case this is coming from reflux.  Patient was given a GI cocktail here.  Patient states she will  follow-up with her PCP tomorrow if she still feels like this.  Discussed labs,  MDM, plan and followup with patient. Discussed sn/sx that should prompt return to the ED. patient agrees with plan.   Meds ordered this encounter  Medications   alum & mag hydroxide-simeth (MAALOX/MYLANTA) 200-200-20 MG/5ML suspension 30 mL   lidocaine (XYLOCAINE) 2 % viscous mouth solution 15 mL     *This clinic note was created using [employer]. Therefore, there may be occasional mistakes despite careful proofreading.     Melynda Ripple, MD 12/13/21 2018

## 2021-12-13 NOTE — Discharge Instructions (Addendum)
1 gram of Tylenol 3-4 times a day as needed for pain.  Make sure you drink plenty of extra fluids.  Some people find salt water gargles and  Traditional Medic[employer]'s "Throat Coat" tea helpful. Take 5 mL of liquid Benadryl and 5 mL of Maalox. Mix it together, and then hold it in your mouth for as long as you can and then swallow. You may do this 4 times a day.  You can also try Pepcid 20 mg twice a day.  Please follow-up with your PCP if not getting any better.  Go to the ER for the signs and symptoms we discussed.  I hope you feel better soon.  Go to www.goodrx.com  or www.costplusdrugs.com to look up your medications. This will give you a list of where you can find your prescriptions at the most affordable prices. Or ask the pharmacist what the cash price is, or if they have any other discount programs available to help make your medication more affordable. This can be less expensive than what you would pay with insurance.

## 2021-12-13 NOTE — ED Triage Notes (Signed)
Pt here with C/O sore throat and just not feeling good since yesterday. No known exposures.

## 2022-01-01 ENCOUNTER — Ambulatory Visit
Admission: EM | Admit: 2022-01-01 | Discharge: 2022-01-01 | Disposition: A | Discharge status: Home / Self Care | Payer: 59 | Attending: Emergency Medicine | Admitting: Emergency Medicine

## 2022-01-01 DIAGNOSIS — U071 COVID-19: Secondary | ICD-10-CM | POA: Diagnosis present

## 2022-01-01 LAB — SARS CORONAVIRUS 2 BY RT PCR: SARS Coronavirus 2 by RT PCR: POSITIVE — AB

## 2022-01-01 MED ORDER — PROMETHAZINE-DM 6.25-15 MG/5ML PO SYRP
5.0000 mL | ORAL_SOLUTION | Freq: Four times a day (QID) | ORAL | 0 refills | Status: DC | PRN
Start: 2022-01-01 — End: 2023-06-08

## 2022-01-01 MED ORDER — IPRATROPIUM BROMIDE 0.06 % NA SOLN: 2.0000 | Freq: Four times a day (QID) | NASAL | 12 refills | Status: DC

## 2022-01-01 MED ORDER — BENZONATATE 100 MG PO CAPS: 200.0000 mg | ORAL_CAPSULE | Freq: Three times a day (TID) | ORAL | 0 refills | Status: DC

## 2022-01-01 MED ORDER — MOLNUPIRAVIR EUA 200MG CAPSULE: 4.0000 | ORAL_CAPSULE | Freq: Two times a day (BID) | ORAL | 0 refills | Status: AC

## 2022-01-01 NOTE — Discharge Instructions (Signed)
You have tested positive for COVID-19 today.  You will need to quarantine for 5 days from onset of your symptoms.  After the 5 days you can break quarantine if your symptoms have improved and you have not had a fever for 24 hours without taking Tylenol or ibuprofen.  You will need to wear a mask around other people for additional 5 days.  Take the molnupiravir twice daily for 5 days for treatment of COVID-19.  Use the Atrovent nasal spray, 2 squirts in each nostril every 6 hours, as needed for runny nose and postnasal drip.  Use the Tessalon Perles every 8 hours during the day.  Take them with a small sip of water.  They may give you some numbness to the base of your tongue or a metallic taste in your mouth, this is normal.  Use the Promethazine DM cough syrup at bedtime for cough and congestion.  It will make you drowsy so do not take it during the day.  If you develop shortness of breath, especially at rest, feel you cannot catch her breath, you cannot speak in full sentences, or late sign your lips start turning blue you need to call 911 and go to the ER.

## 2022-01-01 NOTE — ED Triage Notes (Signed)
Patient c/o fevers, congestion, and cough.   Symptoms started yesterday.   Patient reports she would like a COVID test.

## 2022-01-01 NOTE — ED Provider Notes (Signed)
[facility]    CSN: [id number] Arrival date & time: 01/01/22  5277      History   Chief Complaint Chief Complaint  Patient presents with   Cough   Fever   Nasal Congestion    HPI Misty Murphy is a 51 y.o. female.   HPI  51 year old female here for evaluation respiratory complaints.  Patient reports that she began running a fever yesterday with a Tmax of 100.6 and this was associated with nasal congestion and runny nose with clear nasal discharge, headache, body aches, and a nonproductive cough.  She denies any ear pain, sore throat, shortness of breath or wheezing, GI complaints, recent travel, or known sick contacts.  She is currently on prednisone and doxycycline that she was placed on by her PCP last week for a sore throat.  Past Medical History:  Diagnosis Date   Anxiety    Cancer ([place])    cervical ca   Dysrhythmia    Hypothyroidism    SVT (supraventricular tachycardia) ([facility])     There are no problems to display for this patient.   Past Surgical History:  Procedure Laterality Date   ABDOMINAL HYSTERECTOMY     CARDIAC ELECTROPHYSIOLOGY MAPPING AND ABLATION     x2   COLONOSCOPY WITH PROPOFOL N/A 03/03/2015   Procedure: COLONOSCOPY WITH PROPOFOL;  Surgeon: Lollie Sails, MD;  Location: [facility] ENDOSCOPY;  Service: Endoscopy;  Laterality: N/A;   TONSILLECTOMY     ULNAR NERVE REPAIR      OB History   No obstetric history on file.      Home Medications    Prior to Admission medications   Medication Sig Start Date End Date Taking? Authorizing Provider  aspirin 81 MG chewable tablet Chew 81 mg by mouth daily. 11/20/21  Yes [provider]  benzonatate (TESSALON) 100 MG capsule Take 2 capsules (200 mg total) by mouth every 8 (eight) hours. 01/01/22  Yes Margarette Canada, NP  calcium carbonate (OS-CAL - DOSED IN MG OF ELEMENTAL CALCIUM) 1250 (500 CA) MG tablet Take 1 tablet by mouth daily.   Yes [provider]  citalopram (CELEXA)  10 MG tablet Take 10 mg by mouth daily.   Yes [provider]  colchicine 0.6 MG tablet Take 0.6 mg by mouth daily. 10/13/21  Yes [provider]  diltiazem (CARDIZEM CD) 180 MG 24 hr capsule Take by mouth. 07/05/21 07/05/22 Yes [provider]  doxycycline (VIBRAMYCIN) 100 MG capsule Take 100 mg by mouth 2 (two) times daily. 12/24/21  Yes [provider]  ELIQUIS 5 MG TABS tablet Take 5 mg by mouth 2 (two) times daily. 12/07/21  Yes [provider]  ipratropium (ATROVENT) 0.06 % nasal spray Place 2 sprays into both nostrils 4 (four) times daily. 01/01/22  Yes Margarette Canada, NP  levothyroxine (SYNTHROID, LEVOTHROID) 125 MCG tablet Take 150 mcg by mouth daily before breakfast.    Yes [provider]  metoprolol tartrate (LOPRESSOR) 25 MG tablet SMARTSIG:0.5-1 Tablet(s) By Mouth PRN 08/02/21  Yes [provider]  molnupiravir EUA (LAGEVRIO) 200 mg CAPS capsule Take 4 capsules (800 mg total) by mouth 2 (two) times daily for 5 days. 01/01/22 01/06/22 Yes Margarette Canada, NP  Multiple Vitamin (MULTIVITAMIN) tablet Take 1 tablet by mouth daily.   Yes [provider]  nebivolol (BYSTOLIC) 2.5 MG tablet Take 2.5 mg by mouth daily.   Yes [provider]  predniSONE (DELTASONE) 20 MG tablet Take 2 tablets (40 mg) daily  by mouth 03/18/17  Yes Lorin Picket, PA-C  promethazine-dextromethorphan (PROMETHAZINE-DM) 6.25-15 MG/5ML syrup Take 5 mLs by mouth 4 (four) times daily as needed. 01/01/22  Yes Margarette Canada, NP    Family History Family History  Problem Relation Age of Onset   Breast cancer Maternal Aunt 20    Social History Social History   Tobacco Use   Smoking status: Every Day   Smokeless tobacco: Never  Vaping Use   Vaping Use: Some days  Substance Use Topics   Alcohol use: Yes   Drug use: No     Allergies   Neosporin [neomycin-bacitracin zn-polymyx], Penicillins, Sulfa antibiotics, and Iodinated contrast media   Review  of Systems Review of Systems  Constitutional:  Positive for fever.  HENT:  Positive for congestion and rhinorrhea. Negative for ear pain and sore throat.   Respiratory:  Positive for cough. Negative for shortness of breath and wheezing.   Gastrointestinal:  Negative for diarrhea, nausea and vomiting.  Musculoskeletal:  Positive for arthralgias and myalgias.  Neurological:  Positive for headaches.  Hematological: Negative.   Psychiatric/Behavioral: Negative.       Physical Exam Triage Vital Signs ED Triage Vitals  Enc Vitals Group     BP 01/01/22 0834 (!) 151/77     Pulse Rate 01/01/22 0834 94     Resp --      Temp 01/01/22 0834 (!) 100.4 F (38 C)     Temp Source 01/01/22 0834 Oral     SpO2 01/01/22 0834 96 %     Weight 01/01/22 0832 170 lb (77.1 kg)     Height 01/01/22 0832 '5\' 9"'$  (1.753 m)     Head Circumference --      Peak Flow --      Pain Score 01/01/22 0832 10     Pain Loc --      Pain Edu? --      Excl. in [place]? --    No data found.  Updated Vital Signs BP (!) 151/77 (BP Location: Left Arm)   Pulse 94   Temp (!) 100.4 F (38 C) (Oral)   Ht '5\' 9"'$  (1.753 m)   Wt 170 lb (77.1 kg)   SpO2 96%   BMI 25.10 kg/m   Visual Acuity Right Eye Distance:   Left Eye Distance:   Bilateral Distance:    Right Eye Near:   Left Eye Near:    Bilateral Near:     Physical Exam Vitals and nursing note reviewed.  Constitutional:      Appearance: Normal appearance. She is ill-appearing.  HENT:     Head: Normocephalic and atraumatic.     Right Ear: Tympanic membrane, ear canal and external ear normal. There is no impacted cerumen.     Left Ear: Tympanic membrane, ear canal and external ear normal. There is no impacted cerumen.     Nose: Congestion and rhinorrhea present.     Mouth/Throat:     Mouth: Mucous membranes are moist.     Pharynx: Oropharynx is clear. Posterior oropharyngeal erythema present. No oropharyngeal exudate.  Cardiovascular:     Rate and Rhythm: Normal  rate and regular rhythm.     Pulses: Normal pulses.     Heart sounds: Normal heart sounds. No murmur heard.    No friction rub. No gallop.  Pulmonary:     Effort: Pulmonary effort is normal.     Breath sounds: Normal breath sounds. No wheezing, rhonchi or rales.  Musculoskeletal:  Cervical back: Normal range of motion and neck supple.  Lymphadenopathy:     Cervical: No cervical adenopathy.  Skin:    General: Skin is warm and dry.     Capillary Refill: Capillary refill takes less than 2 seconds.     Findings: No rash.  Neurological:     General: No focal deficit present.     Mental Status: She is alert and oriented to person, place, and time.  Psychiatric:        Mood and Affect: Mood normal.        Behavior: Behavior normal.        Thought Content: Thought content normal.        Judgment: Judgment normal.      UC Treatments / Results  Labs (all labs ordered are listed, but only abnormal results are displayed) Labs Reviewed  SARS CORONAVIRUS 2 BY RT PCR - Abnormal; Notable for the following components:      Result Value   SARS Coronavirus 2 by RT PCR POSITIVE (*)    All other components within normal limits    EKG   Radiology No results found.  Procedures Procedures (including critical care time)  Medications Ordered in UC Medications - No data to display  Initial Impression / Assessment and Plan / UC Course  I have reviewed the triage vital signs and the nursing notes.  Pertinent labs & imaging results that were available during my care of the patient were reviewed by me and considered in my medical decision making (see chart for details).  Patient is a nontoxic, though ill-appearing, 51 year old female here for evaluation of respiratory complaints as outlined in HPI above.  She is currently taking prednisone and doxycycline for a sore throat that was prescribed by her PCP last week on 12/24/2021.  She states that her fever, nasal congestion, and cough all  began yesterday.  She is febrile in clinic at 100.4.  Physical exam reveals pearly gray tympanic membranes bilaterally with normal light reflex and clear external auditory canals.  Nasal mucosa is erythematous and edematous with clear discharge in both nares.  Oropharyngeal exam reveals posterior oropharyngeal erythema with clear postnasal drip.  No injection.  No cervical lymphadenopathy appreciable exam.  Cardiopulmonary exam reveals S1-S2 heart sounds with regular rate and rhythm and lung sounds that are clear to auscultation in all fields.  I will order a COVID PCR as her symptoms are consistent with a viral respiratory infection.  The likelihood of a bacterial source is greatly reduced by the fact that she is currently on broad-spectrum antibiotic therapy.  COVID PCR is positive.  I will discharge patient home molnupiravir twice daily for 5 days for treatment of COVID-19 along with Atrovent nasal spray to help with nasal congestion, Tessalon Perles and Promethazine DM cough syrup, cough and congestion.  I will advise the patient to quarantine for 5 days from onset of symptoms.  Work note provided.   Final Clinical Impressions(s) / UC Diagnoses   Final diagnoses:  [id number]     Discharge Instructions      You have tested positive for COVID-19 today.  You will need to quarantine for 5 days from onset of your symptoms.  After the 5 days you can break quarantine if your symptoms have improved and you have not had a fever for 24 hours without taking Tylenol or ibuprofen.  You will need to wear a mask around other people for additional 5 days.  Take the molnupiravir twice daily for  5 days for treatment of COVID-19.  Use the Atrovent nasal spray, 2 squirts in each nostril every 6 hours, as needed for runny nose and postnasal drip.  Use the Tessalon Perles every 8 hours during the day.  Take them with a small sip of water.  They may give you some numbness to the base of your tongue or a metallic  taste in your mouth, this is normal.  Use the Promethazine DM cough syrup at bedtime for cough and congestion.  It will make you drowsy so do not take it during the day.  If you develop shortness of breath, especially at rest, feel you cannot catch her breath, you cannot speak in full sentences, or late sign your lips start turning blue you need to call 911 and go to the ER.     ED Prescriptions     Medication Sig Dispense Auth. Provider   benzonatate (TESSALON) 100 MG capsule Take 2 capsules (200 mg total) by mouth every 8 (eight) hours. 21 capsule Margarette Canada, NP   ipratropium (ATROVENT) 0.06 % nasal spray Place 2 sprays into both nostrils 4 (four) times daily. 15 mL Margarette Canada, NP   molnupiravir EUA (LAGEVRIO) 200 mg CAPS capsule Take 4 capsules (800 mg total) by mouth 2 (two) times daily for 5 days. 40 capsule Margarette Canada, NP   promethazine-dextromethorphan (PROMETHAZINE-DM) 6.25-15 MG/5ML syrup Take 5 mLs by mouth 4 (four) times daily as needed. 118 mL Margarette Canada, NP      PDMP not reviewed this encounter.   Margarette Canada, NP 01/01/22 [phone]

## 2022-02-11 ENCOUNTER — Other Ambulatory Visit: Payer: Self-pay | Admitting: Family Medicine

## 2022-02-11 DIAGNOSIS — Z1231 Encounter for screening mammogram for malignant neoplasm of breast: Secondary | ICD-10-CM

## 2022-03-21 ENCOUNTER — Ambulatory Visit
Admission: RE | Admit: 2022-03-21 | Discharge: 2022-03-21 | Disposition: A | Discharge status: Home / Self Care | Payer: 59 | Source: Ambulatory Visit | Attending: Family Medicine | Admitting: Family Medicine

## 2022-03-21 DIAGNOSIS — Z1231 Encounter for screening mammogram for malignant neoplasm of breast: Secondary | ICD-10-CM | POA: Insufficient documentation

## 2022-11-02 ENCOUNTER — Ambulatory Visit
Admission: EM | Admit: 2022-11-02 | Discharge: 2022-11-02 | Disposition: A | Discharge status: Home / Self Care | Payer: Managed Care, Other (non HMO) | Attending: Emergency Medicine | Admitting: Emergency Medicine

## 2022-11-02 DIAGNOSIS — J302 Other seasonal allergic rhinitis: Secondary | ICD-10-CM

## 2022-11-02 DIAGNOSIS — J029 Acute pharyngitis, unspecified: Secondary | ICD-10-CM

## 2022-11-02 LAB — GROUP A STREP BY PCR: Group A Strep by PCR: NOT DETECTED

## 2022-11-02 NOTE — ED Triage Notes (Signed)
Pt c/o sore throat x5days  Pt states that symptoms started on Tuesday and she had a strep test done 2 days ago and it was negative at [place] [facility].  Pt states that she woke up this morning and she has felt worse.

## 2022-11-02 NOTE — Discharge Instructions (Addendum)
Your strep test is negative. Most likely you have a viral illness: no antibiotic as indicated at this time, May treat with OTC meds of choice(may use over-the-counter Flonase, daily allergy medicine of choice), salt water gargles, Chloraseptic throat lozenges while awake. Make sure to drink plenty of fluids to stay hydrated(gatorade, water, popsicles,jello,etc), avoid caffeine products. follow up with PCP. Return as needed.

## 2022-11-02 NOTE — ED Provider Notes (Signed)
[facility]    CSN: [id number] Arrival date & time: 11/02/22  0801      History   Chief Complaint Chief Complaint  Patient presents with   Sore Throat    HPI Misty Murphy is a 52 y.o. female.   52 year old female, Misty Murphy, presents to urgent care with chief complaint of sore throat for 5 days.  Patient states she has been using salt water gargles, viscous lidocaine, Chloraseptic without relief.  Patient denies smoking states she stopped vaping, drinks occasionally.  The history is provided by the patient. No language interpreter was used.    Past Medical History:  Diagnosis Date   Anxiety    Cancer (HCC)    cervical ca   Dysrhythmia    Hypothyroidism    SVT (supraventricular tachycardia)     Patient Active Problem List   Diagnosis Date Noted   Sore throat 11/02/2022   Seasonal allergies 11/02/2022    Past Surgical History:  Procedure Laterality Date   ABDOMINAL HYSTERECTOMY     CARDIAC ELECTROPHYSIOLOGY MAPPING AND ABLATION     x2   COLONOSCOPY WITH PROPOFOL N/A 03/03/2015   Procedure: COLONOSCOPY WITH PROPOFOL;  Surgeon: Christena Deem, MD;  Location: [facility] ENDOSCOPY;  Service: Endoscopy;  Laterality: N/A;   TONSILLECTOMY     ULNAR NERVE REPAIR      OB History   No obstetric history on file.      Home Medications    Prior to Admission medications   Medication Sig Start Date End Date Taking? Authorizing Provider  aspirin 81 MG chewable tablet Chew 81 mg by mouth daily. 11/20/21  Yes [provider]  calcium carbonate (OS-CAL - DOSED IN MG OF ELEMENTAL CALCIUM) 1250 (500 CA) MG tablet Take 1 tablet by mouth daily.   Yes [provider]  levothyroxine (SYNTHROID, LEVOTHROID) 125 MCG tablet Take 150 mcg by mouth daily before breakfast.    Yes [provider]  Multiple Vitamin (MULTIVITAMIN) tablet Take 1 tablet by mouth daily.   Yes [provider]  nebivolol (BYSTOLIC) 2.5 MG tablet Take 2.5 mg  by mouth daily.   Yes [provider]  benzonatate (TESSALON) 100 MG capsule Take 2 capsules (200 mg total) by mouth every 8 (eight) hours. 01/01/22   Becky Augusta, NP  citalopram (CELEXA) 10 MG tablet Take 10 mg by mouth daily.    [provider]  colchicine 0.6 MG tablet Take 0.6 mg by mouth daily. 10/13/21   [provider]  diltiazem (CARDIZEM CD) 180 MG 24 hr capsule Take by mouth. 07/05/21 07/05/22  [provider]  doxycycline (VIBRAMYCIN) 100 MG capsule Take 100 mg by mouth 2 (two) times daily. 12/24/21   [provider]  ELIQUIS 5 MG TABS tablet Take 5 mg by mouth 2 (two) times daily. 12/07/21   [provider]  ipratropium (ATROVENT) 0.06 % nasal spray Place 2 sprays into both nostrils 4 (four) times daily. 01/01/22   Becky Augusta, NP  metoprolol tartrate (LOPRESSOR) 25 MG tablet SMARTSIG:0.5-1 Tablet(s) By Mouth PRN 08/02/21   [provider]  predniSONE (DELTASONE) 20 MG tablet Take 2 tablets (40 mg) daily by mouth 03/18/17   Lutricia Feil, PA-C  promethazine-dextromethorphan (PROMETHAZINE-DM) 6.25-15 MG/5ML syrup Take 5 mLs by mouth 4 (four) times daily as needed. 01/01/22   Becky Augusta, NP    Family History Family History  Problem Relation Age of Onset   Breast cancer Maternal Aunt 57    Social  History Social History   Tobacco Use   Smoking status: Former    Types: Cigarettes   Smokeless tobacco: Never  Vaping Use   Vaping Use: Former  Substance Use Topics   Alcohol use: Yes   Drug use: No     Allergies   Neosporin [neomycin-bacitracin zn-polymyx], Penicillins, Sulfa antibiotics, and Iodinated contrast media   Review of Systems Review of Systems  Constitutional:  Negative for fever.  HENT:  Positive for sore throat and voice change.   Respiratory:  Negative for cough and shortness of breath.   Cardiovascular:  Negative for chest pain and palpitations.  All other systems reviewed and are  negative.    Physical Exam Triage Vital Signs ED Triage Vitals  Enc Vitals Group     BP      Pulse      Resp      Temp      Temp src      SpO2      Weight      Height      Head Circumference      Peak Flow      Pain Score      Pain Loc      Pain Edu?      Excl. in GC?    No data found.  Updated Vital Signs BP 117/74 (BP Location: Left Arm)   Pulse 90   Temp 98.7 F (37.1 C) (Oral)   Ht 5\' 9"  (1.753 m)   Wt 170 lb (77.1 kg)   SpO2 96%   BMI 25.10 kg/m   Visual Acuity Right Eye Distance:   Left Eye Distance:   Bilateral Distance:    Right Eye Near:   Left Eye Near:    Bilateral Near:     Physical Exam Vitals and nursing note reviewed.  Constitutional:      Appearance: Normal appearance. She is well-developed and well-groomed.  HENT:     Head: Normocephalic.     Right Ear: Tympanic membrane is retracted.     Left Ear: Tympanic membrane is retracted.     Nose: Congestion present.     Mouth/Throat:     Lips: Pink.     Mouth: Mucous membranes are moist.     Pharynx: Oropharynx is clear.  Cardiovascular:     Rate and Rhythm: Normal rate and regular rhythm.     Pulses: Normal pulses.     Heart sounds: Normal heart sounds.  Pulmonary:     Effort: Pulmonary effort is normal.     Breath sounds: Normal breath sounds and air entry.  Neurological:     General: No focal deficit present.     Mental Status: She is alert and oriented to person, place, and time.     GCS: GCS eye subscore is 4. GCS verbal subscore is 5. GCS motor subscore is 6.  Psychiatric:        Attention and Perception: Attention normal.        Mood and Affect: Mood normal.        Speech: Speech normal.        Behavior: Behavior normal. Behavior is cooperative.      UC Treatments / Results  Labs (all labs ordered are listed, but only abnormal results are displayed) Labs Reviewed  GROUP A STREP BY PCR    EKG   Radiology No results found.  Procedures Procedures (including  critical care time)  Medications Ordered in UC Medications - No data to  display  Initial Impression / Assessment and Plan / UC Course  I have reviewed the triage vital signs and the nursing notes.  Pertinent labs & imaging results that were available during my care of the patient were reviewed by me and considered in my medical decision making (see chart for details).     Ddx: Sore throat, viral illness, allergies Final Clinical Impressions(s) / UC Diagnoses   Final diagnoses:  Sore throat  Seasonal allergies     Discharge Instructions      Your strep test is negative. Most likely you have a viral illness: no antibiotic as indicated at this time, May treat with OTC meds of choice(may use over-the-counter Flonase, daily allergy medicine of choice), salt water gargles, Chloraseptic throat lozenges while awake. Make sure to drink plenty of fluids to stay hydrated(gatorade, water, popsicles,jello,etc), avoid caffeine products. follow up with PCP. Return as needed.     ED Prescriptions   None    PDMP not reviewed this encounter.   Clancy Gourd, NP 11/02/22 [phone]

## 2023-02-21 ENCOUNTER — Other Ambulatory Visit: Payer: Self-pay | Admitting: Family Medicine

## 2023-02-21 DIAGNOSIS — Z1231 Encounter for screening mammogram for malignant neoplasm of breast: Secondary | ICD-10-CM

## 2023-03-25 ENCOUNTER — Ambulatory Visit
Admission: RE | Admit: 2023-03-25 | Discharge: 2023-03-25 | Disposition: A | Discharge status: Home / Self Care | Payer: Managed Care, Other (non HMO) | Source: Ambulatory Visit | Attending: Family Medicine | Admitting: Family Medicine

## 2023-03-25 DIAGNOSIS — Z1231 Encounter for screening mammogram for malignant neoplasm of breast: Secondary | ICD-10-CM | POA: Diagnosis present

## 2023-06-08 ENCOUNTER — Ambulatory Visit
Admission: EM | Admit: 2023-06-08 | Discharge: 2023-06-08 | Disposition: A | Discharge status: Home / Self Care | Payer: Managed Care, Other (non HMO) | Attending: Emergency Medicine | Admitting: Emergency Medicine

## 2023-06-08 DIAGNOSIS — E871 Hypo-osmolality and hyponatremia: Secondary | ICD-10-CM | POA: Insufficient documentation

## 2023-06-08 DIAGNOSIS — E860 Dehydration: Secondary | ICD-10-CM | POA: Diagnosis not present

## 2023-06-08 DIAGNOSIS — K529 Noninfective gastroenteritis and colitis, unspecified: Secondary | ICD-10-CM | POA: Diagnosis not present

## 2023-06-08 LAB — COMPREHENSIVE METABOLIC PANEL
ALT: 55 U/L — ABNORMAL HIGH (ref 0–44)
AST: 62 U/L — ABNORMAL HIGH (ref 15–41)
Albumin: 4.1 g/dL (ref 3.5–5.0)
Alkaline Phosphatase: 73 U/L (ref 38–126)
Anion gap: 7 (ref 5–15)
BUN: 11 mg/dL (ref 6–20)
CO2: 25 mmol/L (ref 22–32)
Calcium: 9.1 mg/dL (ref 8.9–10.3)
Chloride: 101 mmol/L (ref 98–111)
Creatinine, Ser: 0.93 mg/dL (ref 0.44–1.00)
GFR, Estimated: >60 mL/min (ref >60)
Glucose, Bld: 111 mg/dL — ABNORMAL HIGH (ref 70–99)
Potassium: 4 mmol/L (ref 3.5–5.1)
Sodium: 133 mmol/L — ABNORMAL LOW (ref 135–145)
Total Bilirubin: 0.9 mg/dL (ref 0.0–1.2)
Total Protein: 7.4 g/dL (ref 6.5–8.1)

## 2023-06-08 LAB — URINALYSIS, W/ REFLEX TO CULTURE (INFECTION SUSPECTED)
Glucose, UA: NEGATIVE mg/dL
Hgb urine dipstick: NEGATIVE
Ketones, ur: NEGATIVE mg/dL
Leukocytes,Ua: NEGATIVE
Nitrite: NEGATIVE
Protein, ur: NEGATIVE mg/dL
RBC / HPF: NONE SEEN RBC/hpf (ref 0–5)
Specific Gravity, Urine: 1.015 (ref 1.005–1.030)
WBC, UA: NONE SEEN WBC/hpf (ref 0–5)
pH: 6 (ref 5.0–8.0)

## 2023-06-08 LAB — CBC WITH DIFFERENTIAL/PLATELET
Abs Immature Granulocytes: 0.01 10*3/uL (ref 0.00–0.07)
Basophils Absolute: 0 10*3/uL (ref 0.0–0.1)
Basophils Relative: 1 %
Eosinophils Absolute: 0.1 10*3/uL (ref 0.0–0.5)
Eosinophils Relative: 3 %
HCT: 43.6 % (ref 36.0–46.0)
Hemoglobin: 14.8 g/dL (ref 12.0–15.0)
Immature Granulocytes: 0 %
Lymphocytes Relative: 27 %
Lymphs Abs: 1.2 10*3/uL (ref 0.7–4.0)
MCH: 30.7 pg (ref 26.0–34.0)
MCHC: 33.9 g/dL (ref 30.0–36.0)
MCV: 90.5 fL (ref 80.0–100.0)
Monocytes Absolute: 0.5 10*3/uL (ref 0.1–1.0)
Monocytes Relative: 12 %
Neutro Abs: 2.6 10*3/uL (ref 1.7–7.7)
Neutrophils Relative %: 57 %
Platelets: 208 10*3/uL (ref 150–400)
RBC: 4.82 MIL/uL (ref 3.87–5.11)
RDW: 12.4 % (ref 11.5–15.5)
WBC: 4.4 10*3/uL (ref 4.0–10.5)
nRBC: 0 % (ref 0.0–0.2)

## 2023-06-08 MED ORDER — ONDANSETRON 8 MG PO TBDP
8.0000 mg | ORAL_TABLET | Freq: Three times a day (TID) | ORAL | 0 refills | Status: AC | PRN
Start: 2023-06-08 — End: ?

## 2023-06-08 MED ORDER — DICYCLOMINE HCL 20 MG PO TABS
20.0000 mg | ORAL_TABLET | Freq: Two times a day (BID) | ORAL | 0 refills | Status: AC
Start: 2023-06-08 — End: ?

## 2023-06-08 MED ORDER — ONDANSETRON 4 MG PO TBDP
4.0000 mg | ORAL_TABLET | Freq: Once | ORAL | Status: AC
  Administered 2023-06-08: 4 mg via ORAL

## 2023-06-08 NOTE — Discharge Instructions (Addendum)
 Take the Zofran  every 8 hours as needed for nausea and vomiting.  They are an oral disintegrating tablet and you can place them on her under your tongue and then will be absorbed.  Use the Bentyl  (dicyclomine ) every 6 hours as needed for abdominal cramping.  Follow a clear liquid diet for the next 6 to 12 hours.  Clear liquids consist of broth, ginger ale, water, Pedialyte, and Jell-O.  Liquid IV, Pedialyte, Gatorade, or Powerade will help replace your sodium, as well pickle juice or salty snacks.  After 6 to 12 hours, if you are tolerating clear liquids, you can advance to bland foods such as bananas, rice, applesauce, and toast.  If you tolerate bland foods you can continue to advance your diet as you see fit.  If you develop a fever over 100.5, increased abdominal pain, bloody vomit, or bloody stool return for reevaluation or go to the ER.

## 2023-06-08 NOTE — ED Provider Notes (Signed)
 [facility]    CSN: [id number] Arrival date & time: 06/08/23  0941      History   Chief Complaint Chief Complaint  Patient presents with   Emesis   Nausea   Dizziness    HPI Misty Murphy is a 53 y.o. female.   HPI  53 year old female with a past medical history significant for SVT, hypothyroidism, cervical cancer, and anxiety presents for evaluation of GI symptoms that began 3 days ago.  She returned home from a [place] [employer] 3 days ago and developed nausea but no vomiting.  She has had diarrhea stools that are too numerous to count.  She is able to drink fluids and taking small amounts of toast and crackers.  She reports that she experiences abdominal cramping right before she has a stool but otherwise no abdominal pain.  She denies any fever or blood in her stool.  No one else in the accrues party has similar symptoms.  Past Medical History:  Diagnosis Date   Anxiety    Cancer (HCC)    cervical ca   Dysrhythmia    Hypothyroidism    SVT (supraventricular tachycardia) [facility])     Patient Active Problem List   Diagnosis Date Noted   Sore throat 11/02/2022   Seasonal allergies 11/02/2022    Past Surgical History:  Procedure Laterality Date   ABDOMINAL HYSTERECTOMY     CARDIAC ELECTROPHYSIOLOGY MAPPING AND ABLATION     x2   COLONOSCOPY WITH PROPOFOL  N/A 03/03/2015   Procedure: COLONOSCOPY WITH PROPOFOL ;  Surgeon: Gladis RAYMOND Mariner, MD;  Location: [facility] ENDOSCOPY;  Service: Endoscopy;  Laterality: N/A;   TONSILLECTOMY     ULNAR NERVE REPAIR      OB History   No obstetric history on file.      Home Medications    Prior to Admission medications   Medication Sig Start Date End Date Taking? Authorizing Provider  aspirin 81 MG chewable tablet Chew 81 mg by mouth daily. 11/20/21  Yes [provider]  dicyclomine  (BENTYL ) 20 MG tablet Take 1 tablet (20 mg total) by mouth 2 (two) times daily. 06/08/23  Yes Bernardino Ditch,  NP  levothyroxine (SYNTHROID, LEVOTHROID) 125 MCG tablet Take 150 mcg by mouth daily before breakfast.    Yes [provider]  Multiple Vitamin (MULTIVITAMIN) tablet Take 1 tablet by mouth daily.   Yes [provider]  ondansetron  (ZOFRAN -ODT) 8 MG disintegrating tablet Take 1 tablet (8 mg total) by mouth every 8 (eight) hours as needed for nausea or vomiting. 06/08/23  Yes Bernardino Ditch, NP  calcium carbonate (OS-CAL - DOSED IN MG OF ELEMENTAL CALCIUM) 1250 (500 CA) MG tablet Take 1 tablet by mouth daily.   Yes [provider]  citalopram (CELEXA) 10 MG tablet Take 10 mg by mouth daily.    [provider]  colchicine 0.6 MG tablet Take 0.6 mg by mouth daily. 10/13/21   [provider]  diltiazem (CARDIZEM CD) 180 MG 24 hr capsule Take by mouth. 07/05/21 07/05/22  [provider]  ELIQUIS 5 MG TABS tablet Take 5 mg by mouth 2 (two) times daily. 12/07/21   [provider]  metoprolol  tartrate (LOPRESSOR ) 25 MG tablet SMARTSIG:0.5-1 Tablet(s) By Mouth PRN 08/02/21   [provider]  nebivolol  (BYSTOLIC ) 2.5 MG tablet Take 2.5 mg by mouth daily.    [provider]    Family History Family History  Problem Relation Age of Onset   Breast  cancer Maternal Aunt 4    Social History Social History   Tobacco Use   Smoking status: Former    Types: Cigarettes   Smokeless tobacco: Never  Vaping Use   Vaping status: Former  Substance Use Topics   Alcohol use: Yes   Drug use: No     Allergies   Flecainide, Neosporin [neomycin-bacitracin zn-polymyx], Penicillins, Sulfa antibiotics, and Iodinated contrast media   Review of Systems Review of Systems  Gastrointestinal:  Positive for abdominal pain, diarrhea and nausea. Negative for blood in stool and vomiting.  Neurological:  Positive for dizziness. Negative for syncope.     Physical Exam Triage Vital Signs ED Triage Vitals  Encounter Vitals Group     BP       Systolic BP Percentile      Diastolic BP Percentile      Pulse      Resp      Temp      Temp src      SpO2      Weight      Height      Head Circumference      Peak Flow      Pain Score      Pain Loc      Pain Education      Exclude from Growth Chart    No data found.  Updated Vital Signs BP 133/87 (BP Location: Left Arm)   Pulse 96   Temp 98.4 F (36.9 C) (Oral)   Resp 19   SpO2 99%   Visual Acuity Right Eye Distance:   Left Eye Distance:   Bilateral Distance:    Right Eye Near:   Left Eye Near:    Bilateral Near:     Physical Exam Vitals and nursing note reviewed.  Constitutional:      Appearance: Normal appearance. She is ill-appearing.  HENT:     Head: Normocephalic and atraumatic.     Mouth/Throat:     Mouth: Mucous membranes are moist.     Pharynx: Oropharynx is clear. No oropharyngeal exudate or posterior oropharyngeal erythema.     Comments: Oral mucous membranes are sticky. Cardiovascular:     Rate and Rhythm: Normal rate and regular rhythm.     Pulses: Normal pulses.     Heart sounds: Normal heart sounds. No murmur heard.    No friction rub. No gallop.  Pulmonary:     Effort: Pulmonary effort is normal.     Breath sounds: Normal breath sounds. No wheezing, rhonchi or rales.  Abdominal:     General: Abdomen is flat.     Palpations: Abdomen is soft.     Tenderness: There is no abdominal tenderness. There is no guarding or rebound.  Skin:    General: Skin is warm and dry.     Capillary Refill: Capillary refill takes less than 2 seconds.     Comments: Normal skin turgor  Neurological:     General: No focal deficit present.     Mental Status: She is alert and oriented to person, place, and time.      UC Treatments / Results  Labs (all labs ordered are listed, but only abnormal results are displayed) Labs Reviewed  COMPREHENSIVE METABOLIC PANEL - Abnormal; Notable for the following components:      Result Value   Sodium 133 (*)     Glucose, Bld 111 (*)    AST 62 (*)    ALT 55 (*)    All other  components within normal limits  URINALYSIS, W/ REFLEX TO CULTURE (INFECTION SUSPECTED) - Abnormal; Notable for the following components:   Bilirubin Urine SMALL (*)    Bacteria, UA MANY (*)    All other components within normal limits  CBC WITH DIFFERENTIAL/PLATELET    EKG   Radiology No results found.  Procedures Procedures (including critical care time)  Medications Ordered in UC Medications  ondansetron  (ZOFRAN -ODT) disintegrating tablet 4 mg (4 mg Oral Given 06/08/23 1031)    Initial Impression / Assessment and Plan / UC Course  I have reviewed the triage vital signs and the nursing notes.  Pertinent labs & imaging results that were available during my care of the patient were reviewed by me and considered in my medical decision making (see chart for details).   Patient is a pleasant, though mildly ill-appearing 53 year old female presenting for evaluation of GI symptoms after returning home from a 5-day cruise.  Currently, there has been a hyperdensity of norovirus on a cruise ships.  She did travel with additional members in her party and none of them have any GI symptoms.  She reports that she is able to take in fluids but soon afterwards will have a diarrhea stool.  She describes her stools as pure water and nothing formed.  No blood in her stool.  No abdominal pain other than cramping just before she has a diarrhea stool.  Her physical exam does reveal sticky oral mucous membranes and tacky sclera.  Skin turgor is within normal limits.  Cardiopulmonary exam is benign.  Abdomen soft, flat, and nontender.  Given the high propensity of diarrhea that the patient has had I will obtain blood work to evaluate for any systemic infection as well as electrolyte abnormality.  Additionally, I will assess the patient's urine for any evidence of dehydration.  I will have staff administer 4 mg of p.o. Zofran  followed by p.o.  challenge 20 minutes after administration.  CBC is unremarkable.  CMP shows mild hyponatremia with sodium 133.  Potassium and chloride are both normal.  Glucose is mildly elevated at 111.  Renal function is normal.  Transaminases show mild elevation of AST and ALT at 62 and 55 respectively.  Urinalysis shows normal specific gravity with small bilirubin but negative for ketones or protein.  No glucose.  Patient has very mild hyponatremia that can be corrected at home using electrolyte solution such as liquid IV, Gatorade, or Powerade.  I will discharge her home with a diagnosis of viral gastroenteritis and hyponatremia.  I will prescribe Zofran  that she can use at home as well as Bentyl  help with nausea and abdominal cramping.  List of food choices has been provided to help the patient resolve her diarrhea.  ER and return precautions reviewed.     Final Clinical Impressions(s) / UC Diagnoses   Final diagnoses:  Gastroenteritis  Hyponatremia  Dehydration     Discharge Instructions      Take the Zofran  every 8 hours as needed for nausea and vomiting.  They are an oral disintegrating tablet and you can place them on her under your tongue and then will be absorbed.  Use the Bentyl  (dicyclomine ) every 6 hours as needed for abdominal cramping.  Follow a clear liquid diet for the next 6 to 12 hours.  Clear liquids consist of broth, ginger ale, water, Pedialyte, and Jell-O.  Liquid IV, Pedialyte, Gatorade, or Powerade will help replace your sodium, as well pickle juice or salty snacks.  After 6 to 12  hours, if you are tolerating clear liquids, you can advance to bland foods such as bananas, rice, applesauce, and toast.  If you tolerate bland foods you can continue to advance your diet as you see fit.  If you develop a fever over 100.5, increased abdominal pain, bloody vomit, or bloody stool return for reevaluation or go to the ER.      ED Prescriptions     Medication Sig Dispense Auth.  Provider   ondansetron  (ZOFRAN -ODT) 8 MG disintegrating tablet Take 1 tablet (8 mg total) by mouth every 8 (eight) hours as needed for nausea or vomiting. 20 tablet Bernardino Ditch, NP   dicyclomine  (BENTYL ) 20 MG tablet Take 1 tablet (20 mg total) by mouth 2 (two) times daily. 20 tablet Bernardino Ditch, NP      PDMP not reviewed this encounter.   Bernardino Ditch, NP 06/08/23 [phone]

## 2023-06-08 NOTE — ED Triage Notes (Addendum)
 Patient states that he just came off of a 5 days cruise Thursday. Sx started Thursday Hx of vertigo Diarrhea  Nausea  Dizziness

## 2023-09-29 ENCOUNTER — Ambulatory Visit
Admission: RE | Admit: 2023-09-29 | Discharge: 2023-09-29 | Disposition: A | Discharge status: Home / Self Care | Source: Ambulatory Visit | Attending: Physical Medicine and Rehabilitation | Admitting: Physical Medicine and Rehabilitation

## 2023-09-29 ENCOUNTER — Other Ambulatory Visit: Payer: Self-pay | Admitting: Physical Medicine and Rehabilitation

## 2023-09-29 DIAGNOSIS — M5416 Radiculopathy, lumbar region: Secondary | ICD-10-CM | POA: Insufficient documentation

## 2024-02-18 ENCOUNTER — Other Ambulatory Visit: Payer: Self-pay | Admitting: Family Medicine

## 2024-02-18 DIAGNOSIS — Z1231 Encounter for screening mammogram for malignant neoplasm of breast: Secondary | ICD-10-CM

## 2024-03-30 ENCOUNTER — Ambulatory Visit
Admission: RE | Admit: 2024-03-30 | Discharge: 2024-03-30 | Disposition: A | Discharge status: Home / Self Care | Source: Ambulatory Visit | Attending: Family Medicine | Admitting: Family Medicine

## 2024-03-30 DIAGNOSIS — Z1231 Encounter for screening mammogram for malignant neoplasm of breast: Secondary | ICD-10-CM | POA: Diagnosis present

## 2024-06-18 ENCOUNTER — Ambulatory Visit: Payer: Self-pay

## 2024-06-18 DIAGNOSIS — K5730 Diverticulosis of large intestine without perforation or abscess without bleeding: Secondary | ICD-10-CM | POA: Diagnosis not present

## 2024-06-18 DIAGNOSIS — Z83719 Family history of colon polyps, unspecified: Secondary | ICD-10-CM | POA: Diagnosis not present

## 2024-06-18 DIAGNOSIS — Z1211 Encounter for screening for malignant neoplasm of colon: Secondary | ICD-10-CM | POA: Diagnosis present
